# Patient Record
Sex: Female | Born: 1965 | Race: Black or African American | Hispanic: No | Marital: Single | Smoking: Never smoker
Health system: Southern US, Community
[De-identification: ages and names within clinical notes are randomized; demographics above are authoritative.]

## PROBLEM LIST (undated history)

## (undated) DIAGNOSIS — M1990 Unspecified osteoarthritis, unspecified site: Secondary | ICD-10-CM

## (undated) DIAGNOSIS — I10 Essential (primary) hypertension: Secondary | ICD-10-CM

## (undated) HISTORY — PX: BREAST LUMPECTOMY: SHX2

## (undated) HISTORY — PX: BREAST EXCISIONAL BIOPSY: SUR124

## (undated) HISTORY — PX: TUBAL LIGATION: SHX77

## (undated) HISTORY — PX: KNEE SURGERY: SHX244

---

## 1998-12-26 ENCOUNTER — Other Ambulatory Visit: Admission: RE | Admit: 1998-12-26 | Discharge: 1998-12-26 | Payer: Self-pay | Admitting: Obstetrics and Gynecology

## 1999-06-13 ENCOUNTER — Other Ambulatory Visit: Admission: RE | Admit: 1999-06-13 | Discharge: 1999-06-13 | Payer: Self-pay | Admitting: Obstetrics and Gynecology

## 2000-03-10 ENCOUNTER — Other Ambulatory Visit: Admission: RE | Admit: 2000-03-10 | Discharge: 2000-03-10 | Payer: Self-pay | Admitting: Obstetrics and Gynecology

## 2001-10-01 ENCOUNTER — Emergency Department (HOSPITAL_COMMUNITY): Admission: EM | Admit: 2001-10-01 | Discharge: 2001-10-01 | Payer: Self-pay | Admitting: Emergency Medicine

## 2002-07-11 ENCOUNTER — Ambulatory Visit (HOSPITAL_COMMUNITY): Admission: RE | Admit: 2002-07-11 | Discharge: 2002-07-11 | Payer: Self-pay | Admitting: Obstetrics

## 2002-07-11 ENCOUNTER — Encounter: Payer: Self-pay | Admitting: Obstetrics

## 2003-04-27 ENCOUNTER — Inpatient Hospital Stay (HOSPITAL_COMMUNITY): Admission: AD | Admit: 2003-04-27 | Discharge: 2003-04-27 | Payer: Self-pay | Admitting: Obstetrics

## 2003-04-29 ENCOUNTER — Inpatient Hospital Stay (HOSPITAL_COMMUNITY): Admission: AD | Admit: 2003-04-29 | Discharge: 2003-04-29 | Payer: Self-pay | Admitting: Obstetrics

## 2003-04-29 ENCOUNTER — Encounter (INDEPENDENT_AMBULATORY_CARE_PROVIDER_SITE_OTHER): Payer: Self-pay | Admitting: Specialist

## 2003-12-30 ENCOUNTER — Inpatient Hospital Stay (HOSPITAL_COMMUNITY): Admission: AD | Admit: 2003-12-30 | Discharge: 2003-12-30 | Payer: Self-pay | Admitting: Obstetrics

## 2004-01-05 ENCOUNTER — Inpatient Hospital Stay (HOSPITAL_COMMUNITY): Admission: AD | Admit: 2004-01-05 | Discharge: 2004-01-05 | Payer: Self-pay | Admitting: Obstetrics

## 2004-01-13 ENCOUNTER — Encounter (HOSPITAL_COMMUNITY): Admission: RE | Admit: 2004-01-13 | Discharge: 2004-01-26 | Payer: Self-pay | Admitting: Obstetrics

## 2004-01-27 ENCOUNTER — Encounter (HOSPITAL_COMMUNITY): Admission: RE | Admit: 2004-01-27 | Discharge: 2004-02-26 | Payer: Self-pay | Admitting: Obstetrics

## 2004-03-02 ENCOUNTER — Encounter (HOSPITAL_COMMUNITY): Admission: RE | Admit: 2004-03-02 | Discharge: 2004-04-01 | Payer: Self-pay | Admitting: Obstetrics

## 2004-03-28 ENCOUNTER — Inpatient Hospital Stay (HOSPITAL_COMMUNITY): Admission: AD | Admit: 2004-03-28 | Discharge: 2004-03-28 | Payer: Self-pay | Admitting: Obstetrics

## 2004-06-07 ENCOUNTER — Inpatient Hospital Stay (HOSPITAL_COMMUNITY): Admission: AD | Admit: 2004-06-07 | Discharge: 2004-06-09 | Payer: Self-pay | Admitting: Obstetrics

## 2004-06-08 ENCOUNTER — Encounter (INDEPENDENT_AMBULATORY_CARE_PROVIDER_SITE_OTHER): Payer: Self-pay | Admitting: *Deleted

## 2004-06-10 ENCOUNTER — Ambulatory Visit: Admission: RE | Admit: 2004-06-10 | Discharge: 2004-06-10 | Payer: Self-pay | Admitting: Obstetrics

## 2007-09-06 ENCOUNTER — Emergency Department (HOSPITAL_COMMUNITY): Admission: EM | Admit: 2007-09-06 | Discharge: 2007-09-06 | Payer: Self-pay | Admitting: Emergency Medicine

## 2009-04-29 ENCOUNTER — Emergency Department (HOSPITAL_COMMUNITY): Admission: EM | Admit: 2009-04-29 | Discharge: 2009-04-29 | Payer: Self-pay | Admitting: Emergency Medicine

## 2009-06-06 ENCOUNTER — Ambulatory Visit (HOSPITAL_COMMUNITY): Admission: RE | Admit: 2009-06-06 | Discharge: 2009-06-06 | Payer: Self-pay | Admitting: Obstetrics

## 2009-07-25 ENCOUNTER — Encounter: Admission: RE | Admit: 2009-07-25 | Discharge: 2009-10-22 | Payer: Self-pay | Admitting: Orthopedic Surgery

## 2010-06-17 ENCOUNTER — Other Ambulatory Visit (HOSPITAL_COMMUNITY): Payer: Self-pay | Admitting: Obstetrics

## 2010-06-17 DIAGNOSIS — Z1231 Encounter for screening mammogram for malignant neoplasm of breast: Secondary | ICD-10-CM

## 2010-06-28 ENCOUNTER — Ambulatory Visit (HOSPITAL_COMMUNITY): Payer: BC Managed Care – PPO | Attending: Obstetrics

## 2010-09-13 NOTE — Discharge Summary (Signed)
NAMETUWANDA, VOKES                  ACCOUNT NO.:  [id number]   MEDICAL RECORD NO.:  [id number]          PATIENT TYPE:  INP   LOCATION:  9111                          FACILITY:  [facility]   PHYSICIAN:  Kathreen Cosier, M.D.DATE OF BIRTH:  1965/12/01   DATE OF ADMISSION:  06/07/2004  DATE OF DISCHARGE:  06/09/2004                                 DISCHARGE SUMMARY   HOSPITAL COURSE:  The patient is a 45 year old gravida 4 para 2-0-1-2 with  [facility] June 09, 2004 admitted in labor. The patient had a normal vaginal  delivery and desired postpartum tubal ligation. On admission her hemoglobin  11.3, postoperatively 10.1. White count 10.9 and 12. Platelets 156 and 147.  She underwent a postpartum tubal ligation on June 08, 2004 and did well.  She was discharged home on June 09, 2004 - her second postpartum day -  to see me in 6 weeks.   DISCHARGE DIAGNOSIS:  Status post normal vaginal delivery at term and  postpartum tubal ligation.      BAM/MEDQ  D:  07/24/2004  T:  07/24/2004  Job:  [id number]

## 2010-09-13 NOTE — Op Note (Signed)
NAMECurlene Shea NO.:  [id number]   MEDICAL RECORD NO.:  [id number]            PATIENT TYPE:   LOCATION:                                 FACILITY:   PHYSICIAN:  Kathreen Cosier, M.D.   DATE OF BIRTH:   DATE OF PROCEDURE:  06/08/2004  DATE OF DISCHARGE:                                 OPERATIVE REPORT   PREOPERATIVE DIAGNOSIS:  Multiparity.   OPERATION:  Postpartum tubal ligation.   Using epidural, the patient in the supine position, abdomen prepped and  draped.  Bladder emptied with a straight catheter.  A midline subumbilical  incision 1 inch long was made, and carried down through the fascia.  The  fascia cleaned then grasped with the Mayo scissors, the fascia the  peritoneum were opened with Mayo scissors.  The left tube grasped in the mid  portion with a Babcock clamp, __________ placed in the mesosalpinx below the  portion of the tube __________ This was tied, approximately 1 inch of tube  transected.  Hemostasis was satisfactory.  Procedure done in a similar  fashion on the other side.  Probes removed, abdomen closed in layers.  Peritoneum and fascia closed with continuous double-0 Dexon and the skin  closed with a subcuticular stitch of 4-0 Monocryl.   ESTIMATED BLOOD LOSS:  Less than 5 mL.      BAM/MEDQ  D:  06/08/2004  T:  06/08/2004  Job:  [id number]

## 2011-05-27 ENCOUNTER — Other Ambulatory Visit (HOSPITAL_COMMUNITY): Payer: Self-pay | Admitting: Obstetrics

## 2011-05-27 DIAGNOSIS — Z1231 Encounter for screening mammogram for malignant neoplasm of breast: Secondary | ICD-10-CM

## 2011-06-25 ENCOUNTER — Ambulatory Visit (HOSPITAL_COMMUNITY): Payer: BC Managed Care – PPO

## 2011-07-17 ENCOUNTER — Ambulatory Visit (HOSPITAL_COMMUNITY): Payer: BC Managed Care – PPO

## 2011-07-18 ENCOUNTER — Ambulatory Visit (HOSPITAL_COMMUNITY)
Admission: RE | Admit: 2011-07-18 | Discharge: 2011-07-18 | Disposition: A | Discharge status: Home / Self Care | Payer: BC Managed Care – PPO | Source: Ambulatory Visit | Attending: Obstetrics | Admitting: Obstetrics

## 2011-07-18 DIAGNOSIS — Z1231 Encounter for screening mammogram for malignant neoplasm of breast: Secondary | ICD-10-CM | POA: Insufficient documentation

## 2011-07-22 ENCOUNTER — Other Ambulatory Visit: Payer: Self-pay | Admitting: Obstetrics

## 2011-07-22 DIAGNOSIS — R928 Other abnormal and inconclusive findings on diagnostic imaging of breast: Secondary | ICD-10-CM

## 2011-07-25 ENCOUNTER — Ambulatory Visit
Admission: RE | Admit: 2011-07-25 | Discharge: 2011-07-25 | Disposition: A | Discharge status: Home / Self Care | Payer: BC Managed Care – PPO | Source: Ambulatory Visit | Attending: Obstetrics | Admitting: Obstetrics

## 2011-07-25 DIAGNOSIS — R928 Other abnormal and inconclusive findings on diagnostic imaging of breast: Secondary | ICD-10-CM

## 2012-09-24 ENCOUNTER — Encounter: Payer: Self-pay | Admitting: Family

## 2012-09-24 ENCOUNTER — Ambulatory Visit (INDEPENDENT_AMBULATORY_CARE_PROVIDER_SITE_OTHER): Payer: BC Managed Care – PPO | Admitting: Family

## 2012-09-24 VITALS — BP 142/90 | HR 71 | Ht 66.0 in | Wt 264.0 lb

## 2012-09-24 DIAGNOSIS — I10 Essential (primary) hypertension: Secondary | ICD-10-CM | POA: Insufficient documentation

## 2012-09-24 DIAGNOSIS — E669 Obesity, unspecified: Secondary | ICD-10-CM | POA: Insufficient documentation

## 2012-09-24 MED ORDER — HYDROCHLOROTHIAZIDE 25 MG PO TABS: 12.5000 mg | ORAL_TABLET | Freq: Every day | ORAL | Status: DC

## 2012-09-24 NOTE — Progress Notes (Signed)
  Subjective:    Patient ID: Sandra Shea, female    DOB: 1965-09-29, 47 y.o.   MRN: [id number]  HPI 47 year old [place] American female, nonsmoker, new patient to the practice is in to be established. She has concerns of elevated blood pressure but does not going for several weeks. At home she is getting blood pressure readings in the 150s systolically. She has a family history of hypertension her mother, sister but her father's family history is unknown. She reports exercise once a week, Cardio and dance. Reports eating late meals and does not watch her sodium intake.   Review of Systems  Constitutional: Negative.   HENT: Negative.   Respiratory: Negative.   Cardiovascular: Negative.   Gastrointestinal: Negative.   Genitourinary: Negative.   Musculoskeletal: Negative.   Skin: Negative.   Allergic/Immunologic: Negative.   Neurological: Negative.   Hematological: Negative.   Psychiatric/Behavioral: Negative.    Past Medical History  Diagnosis Date  . Hyperlipidemia     History   Social History  . Marital Status: Single    Spouse Name: N/A    Number of Children: N/A  . Years of Education: N/A   Occupational History  . Not on file.   Social History Main Topics  . Smoking status: Never Smoker   . Smokeless tobacco: Not on file  . Alcohol Use: Yes     Comment: socially  . Drug Use: No  . Sexually Active: Not on file   Other Topics Concern  . Not on file   Social History Narrative  . No narrative on file    Past Surgical History  Procedure Laterality Date  . Knee surgery      No family history on file.  Allergies  Allergen Reactions  . Sulfa Antibiotics     No current outpatient prescriptions on file prior to visit.   No current facility-administered medications on file prior to visit.    BP 142/90  Pulse 71  Ht 5\' 6"  (1.676 m)  Wt 264 lb (119.75 kg)  BMI 42.63 kg/m2  SpO2 98%chart    Objective:   Physical Exam  Constitutional: She is oriented to  person, place, and time. She appears well-developed and well-nourished.  HENT:  Right Ear: External ear normal.  Left Ear: External ear normal.  Nose: Nose normal.  Mouth/Throat: Oropharynx is clear and moist.  Neck: Normal range of motion. Neck supple. No thyromegaly present.  Cardiovascular: Normal rate, regular rhythm and normal heart sounds.   Pulmonary/Chest: Effort normal and breath sounds normal.  Abdominal: Soft. Bowel sounds are normal.  Musculoskeletal: Normal range of motion.  Neurological: She is alert and oriented to person, place, and time.  Skin: Skin is warm and dry.  Psychiatric: She has a normal mood and affect.          Assessment and Plan:  Assessment:  1. Hypertension-uncontrolled 2. Obesity  Plan: We'll start with hydrochlorothiazide 12.5 mg once daily. Encouraged low sodium diet. Exercise 30 minutes at least 4 days a week. We'll follow with patient for complete physical exam in 3 weeks and sooner as needed. Advised to see gynecology as soon as possible since she has not seen him in quite some time. However, I have also suggested that she allow is to do her Pap smear she does not see gynecology.

## 2012-09-24 NOTE — Patient Instructions (Signed)
Hypertension As your heart beats, it forces blood through your arteries. This force is your blood pressure. If the pressure is too high, it is called hypertension (HTN) or high blood pressure. HTN is dangerous because you may have it and not know it. High blood pressure may mean that your heart has to work harder to pump blood. Your arteries may be narrow or stiff. The extra work puts you at risk for heart disease, stroke, and other problems.  Blood pressure consists of two numbers, a higher number over a lower, 110/72, for example. It is stated as "110 over 72." The ideal is below 120 for the top number (systolic) and under 80 for the bottom (diastolic). Write down your blood pressure today. You should pay close attention to your blood pressure if you have certain conditions such as:  Heart failure.  Prior heart attack.  Diabetes  Chronic kidney disease.  Prior stroke.  Multiple risk factors for heart disease. To see if you have HTN, your blood pressure should be measured while you are seated with your arm held at the level of the heart. It should be measured at least twice. A one-time elevated blood pressure reading (especially in the Emergency Department) does not mean that you need treatment. There may be conditions in which the blood pressure is different between your right and left arms. It is important to see your caregiver soon for a recheck. Most people have essential hypertension which means that there is not a specific cause. This type of high blood pressure may be lowered by changing lifestyle factors such as:  Stress.  Smoking.  Lack of exercise.  Excessive weight.  Drug/tobacco/alcohol use.  Eating less salt. Most people do not have symptoms from high blood pressure until it has caused damage to the body. Effective treatment can often prevent, delay or reduce that damage. TREATMENT  When a cause has been identified, treatment for high blood pressure is directed at the  cause. There are a large number of medications to treat HTN. These fall into several categories, and your caregiver will help you select the medicines that are best for you. Medications may have side effects. You should review side effects with your caregiver. If your blood pressure stays high after you have made lifestyle changes or started on medicines,   Your medication(s) may need to be changed.  Other problems may need to be addressed.  Be certain you understand your prescriptions, and know how and when to take your medicine.  Be sure to follow up with your caregiver within the time frame advised (usually within two weeks) to have your blood pressure rechecked and to review your medications.  If you are taking more than one medicine to lower your blood pressure, make sure you know how and at what times they should be taken. Taking two medicines at the same time can result in blood pressure that is too low. SEEK IMMEDIATE MEDICAL CARE IF:  You develop a severe headache, blurred or changing vision, or confusion.  You have unusual weakness or numbness, or a faint feeling.  You have severe chest or abdominal pain, vomiting, or breathing problems. MAKE SURE YOU:   Understand these instructions.  Will watch your condition.  Will get help right away if you are not doing well or get worse. Document Released: 04/14/2005 Document Revised: 07/07/2011 Document Reviewed: 12/03/2007 ExitCare Patient Information 2014 ExitCare, [place].   1.5 Gram Low Sodium Diet A 1.5 gram sodium diet restricts the amount of  sodium in the diet to no more than 1.5 g or 1500 mg daily. The American Heart Association recommends Americans over the age of 33 to consume no more than 1500 mg of sodium each day to reduce the risk of developing high blood pressure. Research also shows that limiting sodium may reduce heart attack and stroke risk. Many foods contain sodium for flavor and sometimes as a preservative. When the  amount of sodium in a diet needs to be low, it is important to know what to look for when choosing foods and drinks. The following includes some information and guidelines to help make it easier for you to adapt to a low sodium diet. QUICK TIPS  Do not add salt to food.  Avoid convenience items and fast food.  Choose unsalted snack foods.  Buy lower sodium products, often labeled as "lower sodium" or "no salt added."  Check food labels to learn how much sodium is in 1 serving.  When eating at a restaurant, ask that your food be prepared with less salt or none, if possible. READING FOOD LABELS FOR SODIUM INFORMATION The nutrition facts label is a good place to find how much sodium is in foods. Look for products with no more than 400 mg of sodium per serving. Remember that 1.5 g = 1500 mg. The food label may also list foods as:  Sodium-free: Less than 5 mg in a serving.  Very low sodium: 35 mg or less in a serving.  Low-sodium: 140 mg or less in a serving.  Light in sodium: 50% less sodium in a serving. For example, if a food that usually has 300 mg of sodium is changed to become light in sodium, it will have 150 mg of sodium.  Reduced sodium: 25% less sodium in a serving. For example, if a food that usually has 400 mg of sodium is changed to reduced sodium, it will have 300 mg of sodium. CHOOSING FOODS Grains  Avoid: Salted crackers and snack items. Some cereals, including instant hot cereals. Bread stuffing and biscuit mixes. Seasoned rice or pasta mixes.  Choose: Unsalted snack items. Low-sodium cereals, oats, puffed wheat and rice, shredded wheat. English muffins and bread. Pasta. Meats  Avoid: Salted, canned, smoked, spiced, pickled meats, including fish and poultry. Bacon, ham, sausage, cold cuts, hot dogs, anchovies.  Choose: Low-sodium canned tuna and salmon. Fresh or frozen meat, poultry, and fish. Dairy  Avoid: Processed cheese and spreads. Cottage cheese. Buttermilk  and condensed milk. Regular cheese.  Choose: Milk. Low-sodium cottage cheese. Yogurt. Sour cream. Low-sodium cheese. Fruits and Vegetables  Avoid: Regular canned vegetables. Regular canned tomato sauce and paste. Frozen vegetables in sauces. Olives. Rosita Fire. Relishes. Sauerkraut.  Choose: Low-sodium canned vegetables. Low-sodium tomato sauce and paste. Frozen or fresh vegetables. Fresh and frozen fruit. Condiments  Avoid: Canned and packaged gravies. Worcestershire sauce. Tartar sauce. Barbecue sauce. Soy sauce. Steak sauce. Ketchup. Onion, garlic, and table salt. Meat flavorings and tenderizers.  Choose: Fresh and dried herbs and spices. Low-sodium varieties of mustard and ketchup. Lemon juice. Tabasco sauce. Horseradish. SAMPLE 1.5 GRAM SODIUM MEAL PLAN Breakfast / Sodium (mg)  1 cup low-fat milk / 143 mg  1 whole-wheat English muffin / 240 mg  1 tbs heart-healthy margarine / 153 mg  1 hard-boiled egg / 139 mg  1 small orange / 0 mg Lunch / Sodium (mg)  1 cup raw carrots / 76 mg  2 tbs no salt added peanut butter / 5 mg  2 slices whole-wheat  bread / 270 mg  1 tbs jelly / 6 mg   cup red grapes / 2 mg Dinner / Sodium (mg)  1 cup whole-wheat pasta / 2 mg  1 cup low-sodium tomato sauce / 73 mg  3 oz lean ground beef / 57 mg  1 small side salad (1 cup raw spinach leaves,  cup cucumber,  cup yellow bell pepper) with 1 tsp olive oil and 1 tsp red wine vinegar / 25 mg Snack / Sodium (mg)  1 container low-fat vanilla yogurt / 107 mg  3 graham cracker squares / 127 mg Nutrient Analysis  Calories: 1745  Protein: 75 g  Carbohydrate: 237 g  Fat: 57 g  Sodium: 1425 mg Document Released: 04/14/2005 Document Revised: 07/07/2011 Document Reviewed: 07/16/2009 ExitCare Patient Information 2014 [place], [place].

## 2012-10-04 ENCOUNTER — Ambulatory Visit: Payer: BC Managed Care – PPO | Admitting: Family

## 2012-11-22 ENCOUNTER — Other Ambulatory Visit (HOSPITAL_COMMUNITY): Payer: Self-pay | Admitting: Obstetrics

## 2012-11-22 DIAGNOSIS — Z1231 Encounter for screening mammogram for malignant neoplasm of breast: Secondary | ICD-10-CM

## 2012-11-23 ENCOUNTER — Ambulatory Visit (HOSPITAL_COMMUNITY): Payer: BC Managed Care – PPO

## 2012-11-24 ENCOUNTER — Ambulatory Visit (HOSPITAL_COMMUNITY)
Admission: RE | Admit: 2012-11-24 | Discharge: 2012-11-24 | Disposition: A | Discharge status: Home / Self Care | Payer: BC Managed Care – PPO | Source: Ambulatory Visit | Attending: Obstetrics | Admitting: Obstetrics

## 2012-11-24 DIAGNOSIS — Z1231 Encounter for screening mammogram for malignant neoplasm of breast: Secondary | ICD-10-CM | POA: Insufficient documentation

## 2012-11-25 ENCOUNTER — Other Ambulatory Visit: Payer: Self-pay | Admitting: Obstetrics

## 2012-11-25 DIAGNOSIS — R928 Other abnormal and inconclusive findings on diagnostic imaging of breast: Secondary | ICD-10-CM

## 2012-12-01 ENCOUNTER — Ambulatory Visit
Admission: RE | Admit: 2012-12-01 | Discharge: 2012-12-01 | Disposition: A | Discharge status: Home / Self Care | Payer: BC Managed Care – PPO | Source: Ambulatory Visit | Attending: Obstetrics | Admitting: Obstetrics

## 2012-12-01 DIAGNOSIS — R928 Other abnormal and inconclusive findings on diagnostic imaging of breast: Secondary | ICD-10-CM

## 2013-07-11 ENCOUNTER — Encounter: Payer: Self-pay | Admitting: Family

## 2013-07-11 ENCOUNTER — Ambulatory Visit (INDEPENDENT_AMBULATORY_CARE_PROVIDER_SITE_OTHER): Payer: BC Managed Care – PPO | Admitting: Family

## 2013-07-11 ENCOUNTER — Other Ambulatory Visit: Payer: Self-pay

## 2013-07-11 VITALS — BP 134/80 | HR 86 | Wt 262.0 lb

## 2013-07-11 DIAGNOSIS — I10 Essential (primary) hypertension: Secondary | ICD-10-CM

## 2013-07-11 MED ORDER — HYDROCHLOROTHIAZIDE 25 MG PO TABS: 12.5000 mg | ORAL_TABLET | Freq: Every day | ORAL | Status: DC

## 2013-07-12 NOTE — Progress Notes (Signed)
   Subjective:    Patient ID: Sandra Shea, female    DOB: 03/15/1966, 48 y.o.   MRN: [id number]  HPI 48 year old Afro-American female, presents today for recheck of hypertension and obesity. He is currently doing well on hydrochlorothiazide. However, she reports that she often forgets a dosage. She is not currently exercising or following any particular diet.   Review of Systems  Constitutional: Negative.   HENT: Negative.   Respiratory: Negative.   Cardiovascular: Negative.   Gastrointestinal: Negative.   Endocrine: Negative.   Genitourinary: Negative.   Musculoskeletal: Negative.   Skin: Negative.   Neurological: Negative.   Hematological: Negative.   Psychiatric/Behavioral: Negative.    Past Medical History  Diagnosis Date  . Hyperlipidemia     History   Social History  . Marital Status: Single    Spouse Name: N/A    Number of Children: N/A  . Years of Education: N/A   Occupational History  . Not on file.   Social History Main Topics  . Smoking status: Never Smoker   . Smokeless tobacco: Not on file  . Alcohol Use: Yes     Comment: socially  . Drug Use: No  . Sexual Activity: Not on file   Other Topics Concern  . Not on file   Social History Narrative  . No narrative on file    Past Surgical History  Procedure Laterality Date  . Knee surgery      No family history on file.  Allergies  Allergen Reactions  . Sulfa Antibiotics     Current Outpatient Prescriptions on File Prior to Visit  Medication Sig Dispense Refill  . terbinafine (LAMISIL) 250 MG tablet Take 250 mg by mouth daily.       No current facility-administered medications on file prior to visit.    BP 134/80  Pulse 86  Wt 262 lb (118.842 kg)chart    Objective:   Physical Exam  Constitutional: She is oriented to person, place, and time. She appears well-developed and well-nourished.  HENT:  Right Ear: External ear normal.  Left Ear: External ear normal.  Nose: Nose normal.    Neck: Normal range of motion. Neck supple.  Cardiovascular: Normal rate, regular rhythm and normal heart sounds.   Pulmonary/Chest: Effort normal and breath sounds normal.  Abdominal: Soft. Bowel sounds are normal.  Musculoskeletal: Normal range of motion.  Neurological: She is alert and oriented to person, place, and time.  Skin: Skin is warm and dry.  Psychiatric: She has a normal mood and affect.          Assessment & Plan:

## 2013-08-01 ENCOUNTER — Ambulatory Visit (INDEPENDENT_AMBULATORY_CARE_PROVIDER_SITE_OTHER): Payer: BC Managed Care – PPO | Admitting: Family

## 2013-08-01 ENCOUNTER — Encounter: Payer: Self-pay | Admitting: Family

## 2013-08-01 VITALS — BP 128/80 | HR 74 | Ht 66.0 in | Wt 263.0 lb

## 2013-08-01 DIAGNOSIS — M1711 Unilateral primary osteoarthritis, right knee: Secondary | ICD-10-CM

## 2013-08-01 DIAGNOSIS — E669 Obesity, unspecified: Secondary | ICD-10-CM

## 2013-08-01 DIAGNOSIS — Z23 Encounter for immunization: Secondary | ICD-10-CM

## 2013-08-01 DIAGNOSIS — IMO0002 Reserved for concepts with insufficient information to code with codable children: Secondary | ICD-10-CM

## 2013-08-01 DIAGNOSIS — Z0000 Encounter for general adult medical examination without abnormal findings: Secondary | ICD-10-CM

## 2013-08-01 DIAGNOSIS — I10 Essential (primary) hypertension: Secondary | ICD-10-CM

## 2013-08-01 DIAGNOSIS — M1710 Unilateral primary osteoarthritis, unspecified knee: Secondary | ICD-10-CM

## 2013-08-01 LAB — CBC WITH DIFFERENTIAL/PLATELET
BASOS ABS: 0 10*3/uL (ref 0.0–0.1)
Basophils Relative: 0.6 % (ref 0.0–3.0)
Eosinophils Absolute: 0.1 10*3/uL (ref 0.0–0.7)
Eosinophils Relative: 1.5 % (ref 0.0–5.0)
HCT: 37.3 % (ref 36.0–46.0)
HEMOGLOBIN: 12 g/dL (ref 12.0–15.0)
LYMPHS PCT: 36.8 % (ref 12.0–46.0)
Lymphs Abs: 2.2 10*3/uL (ref 0.7–4.0)
MCHC: 32.3 g/dL (ref 30.0–36.0)
MCV: 80.4 fl (ref 78.0–100.0)
MONOS PCT: 6.6 % (ref 3.0–12.0)
Monocytes Absolute: 0.4 10*3/uL (ref 0.1–1.0)
Neutro Abs: 3.3 10*3/uL (ref 1.4–7.7)
Neutrophils Relative %: 54.5 % (ref 43.0–77.0)
PLATELETS: 187 10*3/uL (ref 150.0–400.0)
RBC: 4.64 Mil/uL (ref 3.87–5.11)
RDW: 14.5 % (ref 11.5–14.6)
WBC: 6 10*3/uL (ref 4.5–10.5)

## 2013-08-01 LAB — COMPREHENSIVE METABOLIC PANEL
ALBUMIN: 3.6 g/dL (ref 3.5–5.2)
ALT: 10 U/L (ref 0–35)
AST: 16 U/L (ref 0–37)
Alkaline Phosphatase: 65 U/L (ref 39–117)
BILIRUBIN TOTAL: 0.7 mg/dL (ref 0.3–1.2)
BUN: 14 mg/dL (ref 6–23)
CALCIUM: 8.8 mg/dL (ref 8.4–10.5)
CHLORIDE: 103 meq/L (ref 96–112)
CO2: 28 meq/L (ref 19–32)
Creatinine, Ser: 0.8 mg/dL (ref 0.4–1.2)
GFR: 101.77 mL/min (ref >60.00)
GLUCOSE: 84 mg/dL (ref 70–99)
POTASSIUM: 3.7 meq/L (ref 3.5–5.1)
SODIUM: 138 meq/L (ref 135–145)
TOTAL PROTEIN: 7 g/dL (ref 6.0–8.3)

## 2013-08-01 LAB — POCT URINALYSIS DIPSTICK
BILIRUBIN UA: NEGATIVE
Glucose, UA: NEGATIVE
Ketones, UA: NEGATIVE
LEUKOCYTES UA: NEGATIVE
NITRITE UA: NEGATIVE
PH UA: 6.5
Protein, UA: NEGATIVE
Spec Grav, UA: 1.02
Urobilinogen, UA: 0.2

## 2013-08-01 LAB — LIPID PANEL
Cholesterol: 185 mg/dL (ref 0–200)
HDL: 45.5 mg/dL (ref >39.00)
LDL Cholesterol: 126 mg/dL — ABNORMAL HIGH (ref 0–99)
Total CHOL/HDL Ratio: 4
Triglycerides: 70 mg/dL (ref 0.0–149.0)
VLDL: 14 mg/dL (ref 0.0–40.0)

## 2013-08-01 LAB — TSH: TSH: 1.53 u[IU]/mL (ref 0.35–5.50)

## 2013-08-01 MED ORDER — MELOXICAM 15 MG PO TABS: 15.0000 mg | ORAL_TABLET | Freq: Every day | ORAL | Status: DC

## 2013-08-01 NOTE — Progress Notes (Signed)
Subjective:    Patient ID: Sandra Shea, female    DOB: 08/10/1965, 48 y.o.   MRN: [id number]  HPI  48 year old AAF, nonsmoker, is in for a CPX. I reviewed all health maintenance protocols including mammography, colonoscopy, bone density Needed referrals were placed. Age and diagnosis  appropriate screening labs were ordered. Her immunization history was reviewed and appropriate vaccinations were ordered. Her current medications and allergies were reviewed and needed refills of her chronic medications were ordered. The plan for yearly health maintenance was discussed all orders and referrals were made as appropriate.  Review of Systems  Constitutional: Negative.   HENT: Negative.   Eyes: Negative.   Respiratory: Negative.   Cardiovascular: Negative.   Gastrointestinal: Negative.   Endocrine: Negative.   Genitourinary: Negative.   Musculoskeletal: Negative.   Skin: Negative.   Allergic/Immunologic: Negative.   Neurological: Negative.   Hematological: Negative.   Psychiatric/Behavioral: Negative.    Past Medical History  Diagnosis Date  . Hyperlipidemia     History   Social History  . Marital Status: Single    Spouse Name: N/A    Number of Children: N/A  . Years of Education: N/A   Occupational History  . Not on file.   Social History Main Topics  . Smoking status: Never Smoker   . Smokeless tobacco: Not on file  . Alcohol Use: Yes     Comment: socially  . Drug Use: No  . Sexual Activity: Not on file   Other Topics Concern  . Not on file   Social History Narrative  . No narrative on file    Past Surgical History  Procedure Laterality Date  . Knee surgery      No family history on file.  Allergies  Allergen Reactions  . Sulfa Antibiotics     Current Outpatient Prescriptions on File Prior to Visit  Medication Sig Dispense Refill  . hydrochlorothiazide (HYDRODIURIL) 25 MG tablet Take 0.5 tablets (12.5 mg total) by mouth daily.  45 tablet  1  .  terbinafine (LAMISIL) 250 MG tablet Take 250 mg by mouth daily.       No current facility-administered medications on file prior to visit.    BP 128/80  Pulse 74  Ht 5\' 6"  (1.676 m)  Wt 263 lb (119.296 kg)  BMI 42.47 kg/m2  SpO2 98%chart     Objective:   Physical Exam  Constitutional: She is oriented to person, place, and time. She appears well-developed and well-nourished.  HENT:  Head: Normocephalic and atraumatic.  Right Ear: External ear normal.  Left Ear: External ear normal.  Nose: Nose normal.  Mouth/Throat: Oropharynx is clear and moist.  Eyes: Conjunctivae and EOM are normal. Pupils are equal, round, and reactive to light.  Neck: Normal range of motion. Neck supple. No thyromegaly present.  Cardiovascular: Normal rate and normal heart sounds.   Pulmonary/Chest: Effort normal and breath sounds normal.  Abdominal: Soft. Bowel sounds are normal.  Musculoskeletal: Normal range of motion.  Neurological: She is alert and oriented to person, place, and time. She has normal reflexes.  Skin: Skin is warm.  Psychiatric: She has a normal mood and affect.          Assessment & Plan:  Sandra Shea was seen today for annual exam.  Diagnoses and associated orders for this visit:  Preventative health care - Cancel: EKG 12-Lead - CMP - Lipid Panel - TSH - POCT urinalysis dipstick - CBC with Differential - EKG 12-Lead  Unspecified essential hypertension - Cancel: EKG 12-Lead - CMP - Lipid Panel - TSH - POCT urinalysis dipstick - CBC with Differential  Obesity, unspecified - CMP - Lipid Panel - TSH - POCT urinalysis dipstick - CBC with Differential  Osteoarthritis of right knee - CMP - Lipid Panel - TSH - POCT urinalysis dipstick - CBC with Differential  Need for prophylactic vaccination with combined diphtheria-tetanus-pertussis (DTP) vaccine - Tdap vaccine greater than or equal to 7yo IM  Other Orders - meloxicam (MOBIC) 15 MG tablet; Take 1 tablet (15 mg  total) by mouth daily.   Call the office if symptoms worsen or persist. Recheck as scheduled and as needed.

## 2013-08-01 NOTE — Progress Notes (Signed)
Pre visit review using our clinic review tool, if applicable. No additional management support is needed unless otherwise documented below in the visit note. 

## 2013-08-01 NOTE — Patient Instructions (Signed)

## 2013-08-02 ENCOUNTER — Telehealth: Payer: Self-pay | Admitting: Family

## 2013-08-02 NOTE — Telephone Encounter (Signed)
Relevant patient education mailed to patient.  

## 2013-08-08 ENCOUNTER — Telehealth: Payer: Self-pay

## 2013-08-08 MED ORDER — PHENTERMINE HCL 37.5 MG PO TABS: 37.5000 mg | ORAL_TABLET | Freq: Every day | ORAL | Status: DC

## 2013-08-08 MED ORDER — METRONIDAZOLE 0.75 % VA GEL: 1.0000 | Freq: Every day | VAGINAL | Status: DC

## 2013-08-08 NOTE — Telephone Encounter (Signed)
Pt aware Rx sent in for bacterial infection

## 2013-09-09 ENCOUNTER — Telehealth: Payer: Self-pay | Admitting: Family

## 2013-09-09 NOTE — Telephone Encounter (Signed)
Pt requesting refill of phentermine (ADIPEX-P) 37.5 MG tablet sent to [facility].

## 2013-09-12 NOTE — Telephone Encounter (Signed)
Ok to fill 

## 2013-09-14 ENCOUNTER — Other Ambulatory Visit: Payer: Self-pay | Admitting: Family

## 2013-09-20 NOTE — Telephone Encounter (Signed)
Needs OV. No

## 2013-09-20 NOTE — Telephone Encounter (Signed)
Called and spoke with pt and pt is aware.  Pt transferred to schedule appt.

## 2013-09-27 ENCOUNTER — Ambulatory Visit (INDEPENDENT_AMBULATORY_CARE_PROVIDER_SITE_OTHER): Payer: BC Managed Care – PPO | Admitting: Family

## 2013-09-27 ENCOUNTER — Encounter: Payer: Self-pay | Admitting: Family

## 2013-09-27 DIAGNOSIS — I10 Essential (primary) hypertension: Secondary | ICD-10-CM

## 2013-09-27 MED ORDER — PHENTERMINE HCL 37.5 MG PO TABS: 37.5000 mg | ORAL_TABLET | Freq: Every day | ORAL | Status: DC

## 2013-09-27 NOTE — Progress Notes (Signed)
Subjective:    Patient ID: Sandra Shea, female    DOB: 1965-04-30, 48 y.o.   MRN: [id number]  HPI 48 y.o. Black female presents for follow up after being started on phentermine for weight loss. Pt has a 12 pound weight loss. Pt states she is exercising by walking 1 mile per day which takes her about thirty minutes. She states she is only eating one meal a day which usually consist of a chicken stew. Acknowledges having dry mouth related to medication but denies palpitations, flutters, GI upset. Denies fever, chills, SOB and change in appetite.     Review of Systems  Constitutional: Negative.   Respiratory: Negative.   Cardiovascular: Negative.   Gastrointestinal: Negative.   Endocrine: Negative.   Musculoskeletal: Negative.   Skin: Negative.   Allergic/Immunologic: Negative.   Neurological: Negative.   Psychiatric/Behavioral: Negative.    Past Medical History  Diagnosis Date  . Hyperlipidemia     History   Social History  . Marital Status: Single    Spouse Name: N/A    Number of Children: N/A  . Years of Education: N/A   Occupational History  . Not on file.   Social History Main Topics  . Smoking status: Never Smoker   . Smokeless tobacco: Not on file  . Alcohol Use: Yes     Comment: socially  . Drug Use: No  . Sexual Activity: Not on file   Other Topics Concern  . Not on file   Social History Narrative  . No narrative on file    Past Surgical History  Procedure Laterality Date  . Knee surgery      No family history on file.  Allergies  Allergen Reactions  . Sulfa Antibiotics     Current Outpatient Prescriptions on File Prior to Visit  Medication Sig Dispense Refill  . hydrochlorothiazide (HYDRODIURIL) 25 MG tablet Take 0.5 tablets (12.5 mg total) by mouth daily.  45 tablet  1  . meloxicam (MOBIC) 15 MG tablet Take 1 tablet (15 mg total) by mouth daily.  30 tablet  4  . metroNIDAZOLE (METROGEL VAGINAL) 0.75 % vaginal gel Place 1 Applicatorful  vaginally at bedtime.  70 g  0  . terbinafine (LAMISIL) 250 MG tablet Take 250 mg by mouth daily.       No current facility-administered medications on file prior to visit.    BP 120/80  Pulse 90  Wt 251 lb (113.853 kg)  SpO2 98%chart    Objective:   Physical Exam  Constitutional: She is oriented to person, place, and time. She appears well-developed and well-nourished. She is active.  Cardiovascular: Normal rate, regular rhythm, normal heart sounds and normal pulses.   Pulmonary/Chest: Effort normal and breath sounds normal.  Abdominal: Soft. Normal appearance and bowel sounds are normal.  Neurological: She is alert and oriented to person, place, and time. She has normal strength.  Skin: Skin is warm, dry and intact.  Psychiatric: She has a normal mood and affect. Her speech is normal and behavior is normal. Thought content normal.          Assessment & Plan:  Sandra Shea was seen today for weight check.  Diagnoses and associated orders for this visit:  Morbid obesity  Unspecified essential hypertension  Other Orders - phentermine (ADIPEX-P) 37.5 MG tablet; Take 1 tablet (37.5 mg total) by mouth daily before breakfast.   Education: Increase exercise at least 30 minutes per day. Eat healthy diet high in vegetables and fruits and  decrease salt and fat intake.   Follow up: One month.

## 2013-09-27 NOTE — Progress Notes (Signed)
Pre visit review using our clinic review tool, if applicable. No additional management support is needed unless otherwise documented below in the visit note. 

## 2013-09-27 NOTE — Patient Instructions (Signed)

## 2013-11-24 ENCOUNTER — Other Ambulatory Visit (HOSPITAL_COMMUNITY): Payer: Self-pay | Admitting: Obstetrics

## 2013-11-24 DIAGNOSIS — Z1231 Encounter for screening mammogram for malignant neoplasm of breast: Secondary | ICD-10-CM

## 2013-12-05 ENCOUNTER — Ambulatory Visit (HOSPITAL_COMMUNITY)
Admission: RE | Admit: 2013-12-05 | Discharge: 2013-12-05 | Disposition: A | Discharge status: Home / Self Care | Payer: BC Managed Care – PPO | Source: Ambulatory Visit | Attending: Obstetrics | Admitting: Obstetrics

## 2013-12-05 ENCOUNTER — Other Ambulatory Visit (HOSPITAL_COMMUNITY): Payer: Self-pay | Admitting: Obstetrics

## 2013-12-05 DIAGNOSIS — Z1231 Encounter for screening mammogram for malignant neoplasm of breast: Secondary | ICD-10-CM

## 2014-02-27 ENCOUNTER — Encounter: Payer: Self-pay | Admitting: Family

## 2014-04-23 ENCOUNTER — Encounter (HOSPITAL_BASED_OUTPATIENT_CLINIC_OR_DEPARTMENT_OTHER): Payer: Self-pay | Admitting: *Deleted

## 2014-04-23 ENCOUNTER — Emergency Department (HOSPITAL_BASED_OUTPATIENT_CLINIC_OR_DEPARTMENT_OTHER)
Admission: EM | Admit: 2014-04-23 | Discharge: 2014-04-23 | Disposition: A | Discharge status: Home / Self Care | Payer: BC Managed Care – PPO | Attending: Emergency Medicine | Admitting: Emergency Medicine

## 2014-04-23 DIAGNOSIS — M1990 Unspecified osteoarthritis, unspecified site: Secondary | ICD-10-CM | POA: Diagnosis not present

## 2014-04-23 DIAGNOSIS — Z8639 Personal history of other endocrine, nutritional and metabolic disease: Secondary | ICD-10-CM | POA: Insufficient documentation

## 2014-04-23 DIAGNOSIS — Z79899 Other long term (current) drug therapy: Secondary | ICD-10-CM | POA: Insufficient documentation

## 2014-04-23 DIAGNOSIS — Z791 Long term (current) use of non-steroidal anti-inflammatories (NSAID): Secondary | ICD-10-CM | POA: Diagnosis not present

## 2014-04-23 DIAGNOSIS — M25562 Pain in left knee: Secondary | ICD-10-CM | POA: Diagnosis not present

## 2014-04-23 MED ORDER — HYDROCODONE-ACETAMINOPHEN 5-325 MG PO TABS: 1.0000 | ORAL_TABLET | ORAL | Status: DC | PRN

## 2014-04-23 NOTE — Discharge Instructions (Signed)
Take the prescribed medication as directed.  May wish to ice and elevate knee to help with pain/swelling. Follow-up with your primary care physician. Return to the ED for new or worsening symptoms.

## 2014-04-23 NOTE — ED Provider Notes (Signed)
CSN: [id number]     Arrival date & time 04/23/14  1226 History   First MD Initiated Contact with Patient 04/23/14 1326     Chief Complaint  Patient presents with  . Knee Pain     (Consider location/radiation/quality/duration/timing/severity/associated sxs/prior Treatment) Patient is a 48 y.o. female presenting with knee pain. The history is provided by the patient and medical records.  Knee Pain   This is a 48 year old female with past medical history significant for hyperlipidemia and arthritis, presenting to the ED for left knee pain. Patient states symptoms initially began 2 weeks ago and have been progressively worsening.  No reported injuries, trauma, or falls. Patient states pain localized along the medial joint line of her left knee with some mild swelling. Pain is worse with weightbearing, ambulation, and flexion of left knee. She has had known arthritis in this knee and is due for knee replacement in her right knee as well. She denies any numbness, paresthesias, or weakness of left leg. Patient states she has been taking her daily Mobic, but is no longer controlling her pain.  Past Medical History  Diagnosis Date  . Hyperlipidemia    Past Surgical History  Procedure Laterality Date  . Knee surgery     No family history on file. History  Substance Use Topics  . Smoking status: Never Smoker   . Smokeless tobacco: Not on file  . Alcohol Use: Yes     Comment: socially   OB History    Gravida Para Term Preterm AB TAB SAB Ectopic Multiple Living   4 3             Review of Systems  Musculoskeletal: Positive for arthralgias.  All other systems reviewed and are negative.   Allergies  Sulfa antibiotics  Home Medications   Prior to Admission medications   Medication Sig Start Date End Date Taking? Authorizing Provider  hydrochlorothiazide (HYDRODIURIL) 25 MG tablet Take 0.5 tablets (12.5 mg total) by mouth daily. 07/11/13   Timoteo Gaul, FNP  meloxicam (MOBIC) 15  MG tablet Take 1 tablet (15 mg total) by mouth daily. 08/01/13   Timoteo Gaul, FNP  metroNIDAZOLE (METROGEL VAGINAL) 0.75 % vaginal gel Place 1 Applicatorful vaginally at bedtime. 08/08/13   Timoteo Gaul, FNP  phentermine (ADIPEX-P) 37.5 MG tablet Take 1 tablet (37.5 mg total) by mouth daily before breakfast. 09/27/13   Timoteo Gaul, FNP  terbinafine (LAMISIL) 250 MG tablet Take 250 mg by mouth daily.    Historical Provider, MD   BP 142/99 mmHg  Pulse 99  Temp(Src) 98.2 F (36.8 C) (Oral)  Resp 16  SpO2 99%   Physical Exam  Constitutional: She is oriented to person, place, and time. She appears well-developed and well-nourished. No distress.  HENT:  Head: Normocephalic and atraumatic.  Mouth/Throat: Oropharynx is clear and moist.  Eyes: Conjunctivae and EOM are normal. Pupils are equal, round, and reactive to light.  Neck: Normal range of motion. Neck supple.  Cardiovascular: Normal rate, regular rhythm and normal heart sounds.   Pulmonary/Chest: Effort normal and breath sounds normal. No respiratory distress. She has no wheezes.  Musculoskeletal:       Left knee: She exhibits decreased range of motion and swelling. She exhibits no effusion, no ecchymosis and no deformity. Tenderness found. Medial joint line tenderness noted.  Left knee with tenderness and mild swelling along medial joint line, no gross bony deformities, limited flexion due to pain, DP pulse and sensation intact, ambulating unassisted  without difficulty No calf asymmetry, tenderness, or palpable cords; no overlying erythema or warmth to touch  Neurological: She is alert and oriented to person, place, and time.  Skin: Skin is warm and dry. She is not diaphoretic.  Psychiatric: She has a normal mood and affect.  Nursing note and vitals reviewed.   ED Course  Procedures (including critical care time) Labs Review Labs Reviewed - No data to display  Imaging Review No results found.   EKG  Interpretation None      MDM   Final diagnoses:  Knee pain, left   48 year old female with atraumatic left knee pain, likely due to her arthritis. Low suspicion for acute fracture, dislocation, septic joint, or gout and therefore do not feel imaging needed at this time.  No clinical signs of DVT.  Patient started on short course of Vicodin, encouraged to continue her anti-inflammatories. Ace wrap was applied for comfort. She will follow-up with her primary care physician.  Discussed plan with patient, he/she acknowledged understanding and agreed with plan of care.  Return precautions given for new or worsening symptoms.  NEYSHA CRIADO, PA-C 04/23/14 Shelly, MD 04/25/14 0020

## 2014-04-23 NOTE — ED Notes (Signed)
Patient having pain in her left knee, started 2 weeks ago

## 2014-07-28 ENCOUNTER — Telehealth: Payer: Self-pay | Admitting: Family

## 2014-07-28 NOTE — Telephone Encounter (Signed)
HCTZ refilled. Needs OV for further refills.

## 2014-07-28 NOTE — Telephone Encounter (Signed)
Pt request refill of the following: hydrochlorothiazide (HYDRODIURIL) 25 MG tablet phentermine (ADIPEX-P) 37.5 MG tablet   Phamacy: [facility]

## 2014-07-31 MED ORDER — HYDROCHLOROTHIAZIDE 25 MG PO TABS: 12.5000 mg | ORAL_TABLET | Freq: Every day | ORAL | Status: DC

## 2014-07-31 NOTE — Telephone Encounter (Signed)
S/w pt and gave her the info  °

## 2014-07-31 NOTE — Telephone Encounter (Signed)
done

## 2014-07-31 NOTE — Telephone Encounter (Signed)
Can you close this please

## 2014-10-18 ENCOUNTER — Ambulatory Visit (INDEPENDENT_AMBULATORY_CARE_PROVIDER_SITE_OTHER): Payer: BC Managed Care – PPO | Admitting: Family

## 2014-10-18 ENCOUNTER — Encounter: Payer: Self-pay | Admitting: Family

## 2014-10-18 VITALS — BP 120/80 | HR 64 | Temp 97.9°F | Wt 257.0 lb

## 2014-10-18 DIAGNOSIS — E669 Obesity, unspecified: Secondary | ICD-10-CM | POA: Diagnosis not present

## 2014-10-18 DIAGNOSIS — M1712 Unilateral primary osteoarthritis, left knee: Secondary | ICD-10-CM | POA: Diagnosis not present

## 2014-10-18 DIAGNOSIS — I10 Essential (primary) hypertension: Secondary | ICD-10-CM | POA: Diagnosis not present

## 2014-10-18 LAB — COMPREHENSIVE METABOLIC PANEL
ALBUMIN: 4.5 g/dL (ref 3.5–5.2)
ALT: 13 U/L (ref 0–35)
AST: 18 U/L (ref 0–37)
Alkaline Phosphatase: 69 U/L (ref 39–117)
BUN: 9 mg/dL (ref 6–23)
CHLORIDE: 101 meq/L (ref 96–112)
CO2: 31 mEq/L (ref 19–32)
Calcium: 10.1 mg/dL (ref 8.4–10.5)
Creatinine, Ser: 0.91 mg/dL (ref 0.40–1.20)
GFR: 84.74 mL/min (ref >60.00)
Glucose, Bld: 91 mg/dL (ref 70–99)
POTASSIUM: 3.7 meq/L (ref 3.5–5.1)
SODIUM: 138 meq/L (ref 135–145)
TOTAL PROTEIN: 8.2 g/dL (ref 6.0–8.3)
Total Bilirubin: 0.7 mg/dL (ref 0.2–1.2)

## 2014-10-18 LAB — TSH: TSH: 1.45 u[IU]/mL (ref 0.35–4.50)

## 2014-10-18 MED ORDER — HYDROCHLOROTHIAZIDE 12.5 MG PO TABS: 12.5000 mg | ORAL_TABLET | Freq: Every day | ORAL | Status: DC

## 2014-10-18 MED ORDER — PHENTERMINE HCL 37.5 MG PO TABS: 37.5000 mg | ORAL_TABLET | Freq: Every day | ORAL | Status: DC

## 2014-10-18 MED ORDER — MELOXICAM 15 MG PO TABS: 15.0000 mg | ORAL_TABLET | Freq: Every day | ORAL | Status: DC

## 2014-10-18 NOTE — Progress Notes (Signed)
Pre visit review using our clinic review tool, if applicable. No additional management support is needed unless otherwise documented below in the visit note. 

## 2014-10-18 NOTE — Progress Notes (Signed)
Subjective:    Patient ID: Sandra Shea, female    DOB: December 06, 1965, 49 y.o.   MRN: [id number]  HPI  49 year old African-American female, nonsmoker with a history of hypertension, obesity, and osteoarthritis is in today for recheck. Denies any concerns. Needs medication refills. Attempting to exercise and follow a healthy diet. However, she has not been here in a year and her weight is up 6 pounds. Has not taken phentermine and normal sleep year. Takes hydrochlorothiazide 12.5 mg once daily for blood pressure management that she tolerates well. Takes meloxicam osteoarthritis.  Review of Systems  Constitutional: Negative.   HENT: Negative.   Respiratory: Negative.   Cardiovascular: Negative.   Gastrointestinal: Negative.   Endocrine: Negative.   Genitourinary: Negative.   Musculoskeletal: Positive for arthralgias. Negative for back pain.  Allergic/Immunologic: Negative.   Neurological: Negative.   Psychiatric/Behavioral: Negative.    Past Medical History  Diagnosis Date  . Hyperlipidemia     History   Social History  . Marital Status: Single    Spouse Name: N/A  . Number of Children: N/A  . Years of Education: N/A   Occupational History  . Not on file.   Social History Main Topics  . Smoking status: Never Smoker   . Smokeless tobacco: Not on file  . Alcohol Use: Yes     Comment: socially  . Drug Use: No  . Sexual Activity: Not on file   Other Topics Concern  . Not on file   Social History Narrative    Past Surgical History  Procedure Laterality Date  . Knee surgery      No family history on file.  Allergies  Allergen Reactions  . Sulfa Antibiotics     Current Outpatient Prescriptions on File Prior to Visit  Medication Sig Dispense Refill  . HYDROcodone-acetaminophen (NORCO/VICODIN) 5-325 MG per tablet Take 1 tablet by mouth every 4 (four) hours as needed. (Patient not taking: Reported on 10/18/2014) 12 tablet 0   No current facility-administered  medications on file prior to visit.    BP 120/80 mmHg  Pulse 64  Temp(Src) 97.9 F (36.6 C) (Oral)  Wt 257 lb (116.574 kg)chart    Objective:   Physical Exam  Constitutional: She is oriented to person, place, and time. She appears well-developed and well-nourished.  HENT:  Right Ear: External ear normal.  Left Ear: External ear normal.  Nose: Nose normal.  Mouth/Throat: Oropharynx is clear and moist.  Neck: Normal range of motion. Neck supple.  Cardiovascular: Normal rate, regular rhythm and normal heart sounds.   Pulmonary/Chest: Effort normal and breath sounds normal.  Abdominal: Soft. Bowel sounds are normal.  Musculoskeletal: Normal range of motion. She exhibits no edema.  Crepitus noted to the left knee  Neurological: She is oriented to person, place, and time.  Skin: Skin is warm and dry.  Psychiatric: She has a normal mood and affect.          Assessment & Plan:  Mahiya was seen today for follow-up.  Diagnoses and all orders for this visit:  Essential hypertension Orders: -     CMP  Obesity Orders: -     CMP -     TSH  Primary osteoarthritis of left knee  Other orders -     hydrochlorothiazide (HYDRODIURIL) 12.5 MG tablet; Take 1 tablet (12.5 mg total) by mouth daily. -     meloxicam (MOBIC) 15 MG tablet; Take 1 tablet (15 mg total) by mouth daily. -  phentermine (ADIPEX-P) 37.5 MG tablet; Take 1 tablet (37.5 mg total) by mouth daily before breakfast.   Continue current medications. Encouraged healthy diet and exercise. Encouraged weight reduction. Recheck weight in 30 days. Call the office with any questions or concerns. Labs obtained today will notify patient pending results

## 2015-02-02 ENCOUNTER — Encounter: Payer: Self-pay | Admitting: Family

## 2015-02-02 ENCOUNTER — Ambulatory Visit (INDEPENDENT_AMBULATORY_CARE_PROVIDER_SITE_OTHER): Payer: BC Managed Care – PPO | Admitting: Family

## 2015-02-02 ENCOUNTER — Telehealth: Payer: Self-pay | Admitting: Family

## 2015-02-02 VITALS — BP 150/100 | HR 80 | Temp 99.0°F | Ht 66.0 in | Wt 258.4 lb

## 2015-02-02 DIAGNOSIS — M1712 Unilateral primary osteoarthritis, left knee: Secondary | ICD-10-CM

## 2015-02-02 DIAGNOSIS — M1711 Unilateral primary osteoarthritis, right knee: Secondary | ICD-10-CM | POA: Diagnosis not present

## 2015-02-02 DIAGNOSIS — I10 Essential (primary) hypertension: Secondary | ICD-10-CM

## 2015-02-02 MED ORDER — HYDROCHLOROTHIAZIDE 12.5 MG PO TABS: 12.5000 mg | ORAL_TABLET | Freq: Every day | ORAL | Status: DC

## 2015-02-02 NOTE — Patient Instructions (Signed)
Low-Sodium Eating Plan °Sodium raises blood pressure and causes water to be held in the body. Getting less sodium from food will help lower your blood pressure, reduce any swelling, and protect your heart, liver, and kidneys. We get sodium by adding salt (sodium chloride) to food. Most of our sodium comes from canned, boxed, and frozen foods. Restaurant foods, fast foods, and pizza are also very high in sodium. Even if you take medicine to lower your blood pressure or to reduce fluid in your body, getting less sodium from your food is important. °WHAT IS MY PLAN? °Most people should limit their sodium intake to 2,300 mg a day. Your health care provider recommends that you limit your sodium intake to __________ a day.  °WHAT DO I NEED TO KNOW ABOUT THIS EATING PLAN? °For the low-sodium eating plan, you will follow these general guidelines: °· Choose foods with a % Daily Value for sodium of less than 5% (as listed on the food label).   °· Use salt-free seasonings or herbs instead of table salt or sea salt.   °· Check with your health care provider or pharmacist before using salt substitutes.   °· Eat fresh foods. °· Eat more vegetables and fruits. °· Limit canned vegetables. If you do use them, rinse them well to decrease the sodium.   °· Limit cheese to 1 oz (28 g) per day.    °· Eat lower-sodium products, often labeled as "lower sodium" or "no salt added." °· Avoid foods that contain monosodium glutamate (MSG). MSG is sometimes added to Chinese food and some canned foods.   °· Check food labels (Nutrition Facts labels) on foods to learn how much sodium is in one serving. °· Eat more home-cooked food and less restaurant, buffet, and fast food.  °· When eating at a restaurant, ask that your food be prepared with less salt, or no salt if possible.   °HOW DO I READ FOOD LABELS FOR SODIUM INFORMATION? °The Nutrition Facts label lists the amount of sodium in one serving of the food. If you eat more than one serving, you  must multiply the listed amount of sodium by the number of servings. °Food labels may also identify foods as: °· Sodium free--Less than 5 mg in a serving. °· Very low sodium--35 mg or less in a serving. °· Low sodium--140 mg or less in a serving. °· Light in sodium--50% less sodium in a serving. For example, if a food that usually has 300 mg of sodium is changed to become light in sodium, it will have 150 mg of sodium. °· Reduced sodium--25% less sodium in a serving. For example, if a food that usually has 400 mg of sodium is changed to reduced sodium, it will have 300 mg of sodium. °WHAT FOODS CAN I EAT? °Grains  °Low-sodium cereals, including oats, puffed wheat and rice, and shredded wheat cereals. Low-sodium crackers. Unsalted rice and pasta. Lower-sodium bread.  °Vegetables  °Frozen or fresh vegetables. Low-sodium or reduced-sodium canned vegetables. Low-sodium or reduced-sodium tomato sauce and paste. Low-sodium or reduced-sodium tomato and vegetable juices.  °Fruits  °Fresh, frozen, and canned fruit. Fruit juice.  °Meat and Other Protein Products  °Low-sodium canned tuna and salmon. Fresh or frozen meat, poultry, seafood, and fish. Lamb. Unsalted nuts. Dried beans, peas, and lentils without added salt. Unsalted canned beans. Homemade soups without salt. Eggs.  °Dairy  °Milk. Soy milk. Ricotta cheese. Low-sodium or reduced-sodium cheeses. Yogurt.  °Condiments  °Fresh and dried herbs and spices. Salt-free seasonings. Onion and garlic powders. Low-sodium varieties of mustard and ketchup. Fresh or refrigerated horseradish. Lemon   juice.  °Fats and Oils   °Reduced-sodium salad dressings. Unsalted butter.   °Other  °Unsalted popcorn and pretzels.  °The items listed above may not be a complete list of recommended foods or beverages. Contact your dietitian for more options. °WHAT FOODS ARE NOT RECOMMENDED? °Grains  °Instant hot cereals. Bread stuffing, pancake, and biscuit mixes. Croutons. Seasoned rice or pasta mixes.  Noodle soup cups. Boxed or frozen macaroni and cheese. Self-rising flour. Regular salted crackers. °Vegetables  °Regular canned vegetables. Regular canned tomato sauce and paste. Regular tomato and vegetable juices. Frozen vegetables in sauces. Salted French fries. Olives. Pickles. Relishes. Sauerkraut. Salsa. °Meat and Other Protein Products  °Salted, canned, smoked, spiced, or pickled meats, seafood, or fish. Bacon, ham, sausage, hot dogs, corned beef, chipped beef, and packaged luncheon meats. Salt pork. Jerky. Pickled herring. Anchovies, regular canned tuna, and sardines. Salted nuts. °Dairy  °Processed cheese and cheese spreads. Cheese curds. Blue cheese and cottage cheese. Buttermilk.  °Condiments  °Onion and garlic salt, seasoned salt, table salt, and sea salt. Canned and packaged gravies. Worcestershire sauce. Tartar sauce. Barbecue sauce. Teriyaki sauce. Soy sauce, including reduced sodium. Steak sauce. Fish sauce. Oyster sauce. Cocktail sauce. Horseradish that you find on the shelf. Regular ketchup and mustard. Meat flavorings and tenderizers. Bouillon cubes. Hot sauce. Tabasco sauce. Marinades. Taco seasonings. Relishes. °Fats and Oils   °Regular salad dressings. Salted butter. Margarine. Ghee. Bacon fat.  °Other  °Potato and tortilla chips. Corn chips and puffs. Salted popcorn and pretzels. Canned or dried soups. Pizza. Frozen entrees and pot pies.   °The items listed above may not be a complete list of foods and beverages to avoid. Contact your dietitian for more information. °  °This information is not intended to replace advice given to you by your health care provider. Make sure you discuss any questions you have with your health care provider. °  °Document Released: 10/04/2001 Document Revised: 05/05/2014 Document Reviewed: 02/16/2013 °Elsevier Interactive Patient Education ©2016 [employer] ° °

## 2015-02-02 NOTE — Progress Notes (Signed)
   Subjective:    Patient ID: Sandra Shea, female    DOB: 09-01-1965, 49 y.o.   MRN: [id number]  HPI 49 year old African-American female, nonsmoker with a history of bilateral osteoarthritis of the knees currently under the care of orthopedics, and hypertension not currently on hydrochlorothiazide is in today for recheck. She has been off of her blood pressure medication for about 3-4 weeks. Blood pressure running in the 573U over 202 systolically. Reports occasional headache. No blurred vision or double vision.   Review of Systems  Constitutional: Negative.   HENT: Negative.   Respiratory: Negative.   Cardiovascular: Negative.   Gastrointestinal: Negative.   Endocrine: Negative.   Genitourinary: Negative.   Musculoskeletal: Positive for arthralgias.       Bilateral knee pain   Skin: Negative.   Allergic/Immunologic: Negative.   Neurological: Negative.   Psychiatric/Behavioral: Negative.    Past Medical History  Diagnosis Date  . Hyperlipidemia     Social History   Social History  . Marital Status: Single    Spouse Name: N/A  . Number of Children: N/A  . Years of Education: N/A   Occupational History  . Not on file.   Social History Main Topics  . Smoking status: Never Smoker   . Smokeless tobacco: Not on file  . Alcohol Use: Yes     Comment: socially  . Drug Use: No  . Sexual Activity: Not on file   Other Topics Concern  . Not on file   Social History Narrative    Past Surgical History  Procedure Laterality Date  . Knee surgery      No family history on file.  Allergies  Allergen Reactions  . Sulfa Antibiotics     Current Outpatient Prescriptions on File Prior to Visit  Medication Sig Dispense Refill  . meloxicam (MOBIC) 15 MG tablet Take 1 tablet (15 mg total) by mouth daily. (Patient not taking: Reported on 02/02/2015) 30 tablet 4   No current facility-administered medications on file prior to visit.    BP 150/100 mmHg  Pulse 80  Temp(Src) 99  F (37.2 C) (Oral)  Ht 5\' 6"  (1.676 m)  Wt 258 lb 7 oz (117.226 kg)  BMI 41.73 kg/m2  SpO2 97%  LMP 07/24/2014chart.    Objective:   Physical Exam  Constitutional: She is oriented to person, place, and time. She appears well-developed and well-nourished.  HENT:  Right Ear: External ear normal.  Left Ear: External ear normal.  Nose: Nose normal.  Mouth/Throat: Oropharynx is clear and moist.  Neck: Normal range of motion. Neck supple. No thyromegaly present.  Cardiovascular: Normal rate, regular rhythm and normal heart sounds.   Pulmonary/Chest: Effort normal and breath sounds normal.  Abdominal: Soft. Bowel sounds are normal.  Musculoskeletal: Normal range of motion.  Neurological: She is alert and oriented to person, place, and time.  Skin: Skin is warm and dry.  Psychiatric: She has a normal mood and affect.          Assessment & Plan:  Diagnoses and all orders for this visit:  Essential hypertension  Primary osteoarthritis of right knee  Primary osteoarthritis of left knee  Other orders -     hydrochlorothiazide (HYDRODIURIL) 12.5 MG tablet; Take 1 tablet (12.5 mg total) by mouth daily.   Encourage medication compliance. Resume Hodge or thiazide 12.5 mg once daily. Low-sodium diet. Follow with orthopedics as scheduled.

## 2015-02-02 NOTE — Telephone Encounter (Signed)
Pt forgot to ask you at her visit if she can have a refill on her Phentermine please.

## 2015-02-02 NOTE — Progress Notes (Signed)
Pre visit review using our clinic review tool, if applicable. No additional management support is needed unless otherwise documented below in the visit note. 

## 2015-02-05 NOTE — Telephone Encounter (Signed)
No, she never comes back for follow-up weight. We cannot evaluate risk vs benefits

## 2015-02-06 NOTE — Telephone Encounter (Signed)
Spoke to patient. Patient states she wants to continue working on lowering her BP before scheduling appointment. Will call office when feels is ready to make appointment

## 2015-05-13 ENCOUNTER — Encounter (HOSPITAL_BASED_OUTPATIENT_CLINIC_OR_DEPARTMENT_OTHER): Payer: Self-pay | Admitting: Emergency Medicine

## 2015-05-13 ENCOUNTER — Emergency Department (HOSPITAL_BASED_OUTPATIENT_CLINIC_OR_DEPARTMENT_OTHER)
Admission: EM | Admit: 2015-05-13 | Discharge: 2015-05-13 | Disposition: A | Discharge status: Home / Self Care | Payer: BC Managed Care – PPO | Attending: Emergency Medicine | Admitting: Emergency Medicine

## 2015-05-13 ENCOUNTER — Emergency Department (HOSPITAL_BASED_OUTPATIENT_CLINIC_OR_DEPARTMENT_OTHER): Payer: BC Managed Care – PPO

## 2015-05-13 DIAGNOSIS — S3992XA Unspecified injury of lower back, initial encounter: Secondary | ICD-10-CM | POA: Diagnosis present

## 2015-05-13 DIAGNOSIS — S39012A Strain of muscle, fascia and tendon of lower back, initial encounter: Secondary | ICD-10-CM | POA: Diagnosis not present

## 2015-05-13 DIAGNOSIS — Y92410 Unspecified street and highway as the place of occurrence of the external cause: Secondary | ICD-10-CM | POA: Insufficient documentation

## 2015-05-13 DIAGNOSIS — Y9389 Activity, other specified: Secondary | ICD-10-CM | POA: Insufficient documentation

## 2015-05-13 DIAGNOSIS — Y998 Other external cause status: Secondary | ICD-10-CM | POA: Diagnosis not present

## 2015-05-13 DIAGNOSIS — S0990XA Unspecified injury of head, initial encounter: Secondary | ICD-10-CM | POA: Insufficient documentation

## 2015-05-13 DIAGNOSIS — I10 Essential (primary) hypertension: Secondary | ICD-10-CM | POA: Diagnosis not present

## 2015-05-13 HISTORY — DX: Essential (primary) hypertension: I10

## 2015-05-13 MED ORDER — NAPROXEN 500 MG PO TABS: 500.0000 mg | ORAL_TABLET | Freq: Two times a day (BID) | ORAL | Status: DC
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
## 2015-05-13 NOTE — ED Notes (Signed)
MD at bedside. 
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
## 2015-05-13 NOTE — ED Notes (Signed)
Presents with lower back pain. Radiates to mid back, also radiates to left buttocks and to LLE

## 2015-05-13 NOTE — ED Notes (Signed)
Denies any loss of consciousness also denies any neck pain or arm pain

## 2015-05-13 NOTE — ED Notes (Signed)
Denies any numbness or tingling in legs.

## 2015-05-13 NOTE — Discharge Instructions (Signed)

## 2015-08-15 MED FILL — PHENAZOPYRIDINE 200 MG TAB: 200 | 10 days supply | Qty: 30 | Fill #0

## 2015-08-15 MED FILL — CEPHALEXIN 500 MG CAPSULE: 500 | 7 days supply | Qty: 14 | Fill #0

## 2015-08-19 ENCOUNTER — Encounter (HOSPITAL_BASED_OUTPATIENT_CLINIC_OR_DEPARTMENT_OTHER): Payer: Self-pay | Admitting: *Deleted

## 2015-08-19 ENCOUNTER — Emergency Department (HOSPITAL_BASED_OUTPATIENT_CLINIC_OR_DEPARTMENT_OTHER)
Admission: EM | Admit: 2015-08-19 | Discharge: 2015-08-19 | Disposition: A | Discharge status: Home / Self Care | Payer: BC Managed Care – PPO | Attending: Emergency Medicine | Admitting: Emergency Medicine

## 2015-08-19 DIAGNOSIS — Z791 Long term (current) use of non-steroidal anti-inflammatories (NSAID): Secondary | ICD-10-CM | POA: Insufficient documentation

## 2015-08-19 DIAGNOSIS — Z3202 Encounter for pregnancy test, result negative: Secondary | ICD-10-CM | POA: Insufficient documentation

## 2015-08-19 DIAGNOSIS — N3091 Cystitis, unspecified with hematuria: Secondary | ICD-10-CM

## 2015-08-19 DIAGNOSIS — Z792 Long term (current) use of antibiotics: Secondary | ICD-10-CM | POA: Insufficient documentation

## 2015-08-19 DIAGNOSIS — I10 Essential (primary) hypertension: Secondary | ICD-10-CM | POA: Insufficient documentation

## 2015-08-19 DIAGNOSIS — Z79899 Other long term (current) drug therapy: Secondary | ICD-10-CM | POA: Insufficient documentation

## 2015-08-19 DIAGNOSIS — N3090 Cystitis, unspecified without hematuria: Secondary | ICD-10-CM | POA: Insufficient documentation

## 2015-08-19 DIAGNOSIS — R109 Unspecified abdominal pain: Secondary | ICD-10-CM | POA: Diagnosis present

## 2015-08-19 LAB — CBC WITH DIFFERENTIAL/PLATELET
Basophils Absolute: 0 10*3/uL (ref 0.0–0.1)
Basophils Relative: 0 %
Eosinophils Absolute: 0.1 10*3/uL (ref 0.0–0.7)
Eosinophils Relative: 1 %
HCT: 42.3 % (ref 36.0–46.0)
Hemoglobin: 13.6 g/dL (ref 12.0–15.0)
Lymphocytes Relative: 37 %
Lymphs Abs: 2.9 10*3/uL (ref 0.7–4.0)
MCH: 25.8 pg — ABNORMAL LOW (ref 26.0–34.0)
MCHC: 32.2 g/dL (ref 30.0–36.0)
MCV: 80.1 fL (ref 78.0–100.0)
Monocytes Absolute: 0.6 10*3/uL (ref 0.1–1.0)
Monocytes Relative: 8 %
Neutro Abs: 4.3 10*3/uL (ref 1.7–7.7)
Neutrophils Relative %: 54 %
Platelets: 231 10*3/uL (ref 150–400)
RBC: 5.28 MIL/uL — ABNORMAL HIGH (ref 3.87–5.11)
RDW: 13.9 % (ref 11.5–15.5)
WBC: 7.9 10*3/uL (ref 4.0–10.5)

## 2015-08-19 LAB — LIPASE, BLOOD: Lipase: 16 U/L (ref 11–51)

## 2015-08-19 LAB — COMPREHENSIVE METABOLIC PANEL
ALT: 13 U/L — ABNORMAL LOW (ref 14–54)
AST: 16 U/L (ref 15–41)
Albumin: 4.4 g/dL (ref 3.5–5.0)
Alkaline Phosphatase: 69 U/L (ref 38–126)
Anion gap: 10 (ref 5–15)
BUN: 15 mg/dL (ref 6–20)
CO2: 21 mmol/L — ABNORMAL LOW (ref 22–32)
Calcium: 9.3 mg/dL (ref 8.9–10.3)
Chloride: 107 mmol/L (ref 101–111)
Creatinine, Ser: 1.02 mg/dL — ABNORMAL HIGH (ref 0.44–1.00)
GFR calc Af Amer: >60 mL/min (ref >60)
GFR calc non Af Amer: >60 mL/min (ref >60)
Glucose, Bld: 109 mg/dL — ABNORMAL HIGH (ref 65–99)
Potassium: 3.9 mmol/L (ref 3.5–5.1)
Sodium: 138 mmol/L (ref 135–145)
Total Bilirubin: 0.9 mg/dL (ref 0.3–1.2)
Total Protein: 8 g/dL (ref 6.5–8.1)

## 2015-08-19 LAB — URINALYSIS, ROUTINE W REFLEX MICROSCOPIC
BILIRUBIN URINE: NEGATIVE
Glucose, UA: NEGATIVE mg/dL
KETONES UR: 15 mg/dL — AB
Leukocytes, UA: NEGATIVE
Nitrite: NEGATIVE
PH: 6 (ref 5.0–8.0)
Protein, ur: >300 mg/dL — AB
Specific Gravity, Urine: 1.03 (ref 1.005–1.030)

## 2015-08-19 LAB — WET PREP, GENITAL
Clue Cells Wet Prep HPF POC: NONE SEEN
Sperm: NONE SEEN
Trich, Wet Prep: NONE SEEN
Yeast Wet Prep HPF POC: NONE SEEN

## 2015-08-19 LAB — URINE MICROSCOPIC-ADD ON
SQUAMOUS EPITHELIAL / LPF: NONE SEEN
WBC UA: NONE SEEN WBC/hpf (ref 0–5)

## 2015-08-19 LAB — PREGNANCY, URINE: Preg Test, Ur: NEGATIVE

## 2015-08-19 MED ORDER — ONDANSETRON HCL 4 MG/2ML IJ SOLN
4.0000 mg | Freq: Once | INTRAMUSCULAR | Status: AC | PRN
  Administered 2015-08-19: 4 mg via INTRAVENOUS
  Filled 2015-08-19: qty 2

## 2015-08-19 MED ORDER — MORPHINE SULFATE (PF) 4 MG/ML IV SOLN
4.0000 mg | Freq: Once | INTRAVENOUS | Status: AC
  Administered 2015-08-19: 4 mg via INTRAVENOUS
  Filled 2015-08-19: qty 1

## 2015-08-19 MED ORDER — DEXTROSE 5 % IV SOLN
1.0000 g | Freq: Once | INTRAVENOUS | Status: AC
  Administered 2015-08-19: 1 g via INTRAVENOUS
  Filled 2015-08-19: qty 10

## 2015-08-19 NOTE — Discharge Instructions (Signed)

## 2015-08-19 NOTE — ED Notes (Signed)
Patient c/o abd pain and constant urge to urinate since Thursday. She went to urgent care on Friday and was given a shot for pain. Pain has grown worse.

## 2015-08-19 NOTE — ED Provider Notes (Signed)
CSN: [id number]     Arrival date & time 08/19/15  1714 History   First MD Initiated Contact with Patient 08/19/15 1731     Chief Complaint  Patient presents with  . Abdominal Pain   HPI   50 year old female presents today with complaints of urinary tract infection. Patient reports approximately 10 days ago she sought care of her urologist for pain over her bladder. She felt as if she was developing a urinary tract infection as this with typical symptoms of that. She reports at that time that her urinalysis was negative, but was started on Keflex. She reports following up with her primary care provider on Wednesday and receiving a prescription for pain medication. She was also tested for menopause. She notes that pain continued to persist, she had blood in her urine at that time. She notes she called back to the nurse practitioner that she saw yesterday [facility]) and was told that her urine grew out bacteria. She was placed on pain medication and Augmentin at that time. She reports that initially when she was seen she had a CT with contrast which showed no significant findings including kidney stones or pelvic abnormalities. She was scheduled for a cystoscopy 3 days from today. Patient reports pain is sharp in nature and located over her bladder, she has frequent and urgency with very small amount of bloody urine noted. She reports the pain is 10 out of 10 when she is awake, but notes she has no pain when sleeping. She denies any vaginal bleeding or discharge. She denies any fever, nausea, vomiting, upper abdominal pain, or rectal bleeding.    Past Medical History  Diagnosis Date  . Hypertension    Past Surgical History  Procedure Laterality Date  . Knee surgery     No family history on file. Social History  Substance Use Topics  . Smoking status: Never Smoker   . Smokeless tobacco: None  . Alcohol Use: Yes     Comment: socially   OB History    Gravida Para Term Preterm AB TAB  SAB Ectopic Multiple Living   4 3             Review of Systems  All other systems reviewed and are negative.   Allergies  Sulfa antibiotics  Home Medications   Prior to Admission medications   Medication Sig Start Date End Date Taking? Authorizing Provider  amoxicillin-clavulanate (AUGMENTIN) 875-125 MG tablet Take 1 tablet by mouth 2 (two) times daily.   Yes Historical Provider, MD  cephALEXin (KEFLEX) 500 MG capsule Take 500 mg by mouth 4 (four) times daily.   Yes Historical Provider, MD  oxyCODONE-acetaminophen (PERCOCET) 7.5-325 MG tablet Take 1 tablet by mouth 2 (two) times daily.   Yes Historical Provider, MD  phenazopyridine (PYRIDIUM) 200 MG tablet Take 200 mg by mouth 3 (three) times daily as needed for pain.   Yes Historical Provider, MD  hydrochlorothiazide (HYDRODIURIL) 12.5 MG tablet Take 1 tablet (12.5 mg total) by mouth daily. 02/02/15   Kennyth Arnold, FNP  meloxicam (MOBIC) 15 MG tablet Take 1 tablet (15 mg total) by mouth daily. Patient not taking: Reported on 02/02/2015 10/18/14   Kennyth Arnold, FNP  naproxen (NAPROSYN) 500 MG tablet Take 1 tablet (500 mg total) by mouth 2 (two) times daily. 05/13/15   Ezequiel Essex, MD   BP 138/97 mmHg  Pulse 101  Temp(Src) 97.6 F (36.4 C) (Oral)  Resp 20  Ht 5\' 6"  (1.676 m)  Wt 117.935 kg  BMI 41.99 kg/m2  SpO2 99%  LMP 11/18/2012   Physical Exam  Constitutional: She is oriented to person, place, and time. She appears well-developed and well-nourished.  HENT:  Head: Normocephalic and atraumatic.  Eyes: Conjunctivae are normal. Pupils are equal, round, and reactive to light. Right eye exhibits no discharge. Left eye exhibits no discharge. No scleral icterus.  Neck: Normal range of motion. No JVD present. No tracheal deviation present.  Pulmonary/Chest: Effort normal. No stridor.  Abdominal: Soft. She exhibits no distension and no mass. There is tenderness. There is no rebound and no guarding.  Tenderness to palpation  over the bladder  Genitourinary: Vagina normal. There is no rash, tenderness, lesion or injury on the right labia. There is no rash, tenderness, lesion or injury on the left labia. No erythema, tenderness or bleeding in the vagina. No foreign body around the vagina. No signs of injury around the vagina. No vaginal discharge found.  Musculoskeletal: Normal range of motion. She exhibits no edema or tenderness.  Neurological: She is alert and oriented to person, place, and time. Coordination normal.  Psychiatric: She has a normal mood and affect. Her behavior is normal. Judgment and thought content normal.  Nursing note and vitals reviewed.     ED Course  Procedures (including critical care time) Labs Review Labs Reviewed  WET PREP, GENITAL - Abnormal; Notable for the following:    WBC, Wet Prep HPF POC MODERATE (*)    All other components within normal limits  URINALYSIS, ROUTINE W REFLEX MICROSCOPIC (NOT AT [facility]) - Abnormal; Notable for the following:    APPearance CLOUDY (*)    Hgb urine dipstick LARGE (*)    Ketones, ur 15 (*)    Protein, ur >300 (*)    All other components within normal limits  URINE MICROSCOPIC-ADD ON - Abnormal; Notable for the following:    Bacteria, UA FEW (*)    All other components within normal limits  CBC WITH DIFFERENTIAL/PLATELET - Abnormal; Notable for the following:    RBC 5.28 (*)    MCH 25.8 (*)    All other components within normal limits  COMPREHENSIVE METABOLIC PANEL - Abnormal; Notable for the following:    CO2 21 (*)    Glucose, Bld 109 (*)    Creatinine, Ser 1.02 (*)    ALT 13 (*)    All other components within normal limits  URINE CULTURE  PREGNANCY, URINE  LIPASE, BLOOD  GC/CHLAMYDIA PROBE AMP ([place]) NOT AT [facility]    Imaging Review No results found. I have personally reviewed and evaluated these images and lab results as part of my medical decision-making.   EKG Interpretation None      MDM   Final diagnoses:   Hemorrhagic cystitis    Labs:Wet prep, CBC, CMP, lipase, urinalysis-  large hemoglobin in the urine  Imaging:  Consults:  Therapeutics: Ceftriaxone, Zofran, morphine  Discharge Meds:   Assessment/Plan:50 year old female presents today with likely hemorrhagic cystitis. I still have concern for noninfectious cause of symptoms, patient has a cystoscopy on Wednesday which is 3 days from today. This is adequate time for follow-up. Patient's labs are reassuring here, she has no signs of anemia, no signs of infectious etiology. Patient's pain was managed here with above medications, she will be discharged home with instructions to continue using antibiotics, follow-up with her urologist as previously scheduled. Patient had a normal CT scan with contrast several days prior, with evidence of blood in her urine  and no evidence of vaginal bleeding this is likely coming from the bladder, have los patient for any other L the pathology in light of recent imaging studies and findings noted above. Patient will be given strict return precautions, she verbalized understanding and agreement today's plan had no further questions or concerns at the time of discharge        Okey Regal, PA-C 08/19/15 2033  Leonard Schwartz, MD 08/23/15 [phone]

## 2015-08-20 LAB — URINE CULTURE
Culture: NO GROWTH
Special Requests: NORMAL

## 2015-08-20 LAB — GC/CHLAMYDIA PROBE AMP (~~LOC~~) NOT AT ARMC
Chlamydia: NEGATIVE
Neisseria Gonorrhea: NEGATIVE

## 2015-12-22 ENCOUNTER — Encounter: Payer: Self-pay | Admitting: *Deleted

## 2016-04-24 ENCOUNTER — Telehealth (INDEPENDENT_AMBULATORY_CARE_PROVIDER_SITE_OTHER): Payer: Self-pay | Admitting: Orthopedic Surgery

## 2016-04-24 NOTE — Telephone Encounter (Signed)
Patient called asked for a call back from Dr Marlou Sa to discuss surgery. The number to contact her is [phone]

## 2016-04-25 NOTE — Telephone Encounter (Signed)
Please advise 

## 2016-04-30 ENCOUNTER — Telehealth (INDEPENDENT_AMBULATORY_CARE_PROVIDER_SITE_OTHER): Payer: Self-pay | Admitting: Orthopedic Surgery

## 2016-04-30 NOTE — Telephone Encounter (Signed)
Please advise, see previous messages please, needs a call back to discuss surgery. [phone]

## 2016-05-01 NOTE — Telephone Encounter (Signed)
See other note

## 2016-05-01 NOTE — Telephone Encounter (Signed)
IC patient and she wants to know if you think it would be beneficial for her to look at the flexogenic clinic?  I have told her what they offer, same as we do.  She wants to know if you recommend she proceed with surgery vs. Looking at flexogenics clinic?  Pls advise and I can call her, or you can either one.  Thanks.

## 2016-05-05 NOTE — Telephone Encounter (Signed)
I think surgery would be her best option even though she is young.  Her arthritis is severe.  As he stated the flexor [facility] does not really offer anything more than what we're doing minutes going to give a predictably positive outcome  pls call thx

## 2016-05-06 NOTE — Telephone Encounter (Signed)
IC pt and advised.  Appt made at her request to discuss TKA

## 2016-05-12 ENCOUNTER — Encounter (INDEPENDENT_AMBULATORY_CARE_PROVIDER_SITE_OTHER): Payer: Self-pay | Admitting: Orthopedic Surgery

## 2016-05-12 ENCOUNTER — Ambulatory Visit (INDEPENDENT_AMBULATORY_CARE_PROVIDER_SITE_OTHER): Payer: BC Managed Care – PPO | Admitting: Orthopedic Surgery

## 2016-05-12 DIAGNOSIS — M170 Bilateral primary osteoarthritis of knee: Secondary | ICD-10-CM | POA: Diagnosis not present

## 2016-05-16 NOTE — Progress Notes (Signed)
Office Visit Note   Patient: Sandra Shea           Date of Birth: 12-Sep-1965           MRN: [id number] Visit Date: 05/12/2016 Requested by: Kennyth Arnold, [address] [place], [place] [address] PCP: Kennyth Arnold, FNP  Subjective: Chief Complaint  Patient presents with  . Left Knee - Pain  . Right Knee - Pain    HPI  Sandra Shea is a 51 year old female with known bilateral knee arthritis.  Last seen 01/01/2016.  Status of the left knee is worse.  He reports constant pain with walking or any activity.  She is taking ibuprofen or Aleve for her symptoms.  Cold makes her symptoms worse.  Pain at night is also worse.  She can walk less than one city block.  She has increased her weight by 15 pounds since last seen.  She's had gel injections and cortisone injections in the past.  No pectoralis major teeth fall out.  Last Synvisc injection was 915.  She's discussing and considering total knee replacement.              Review of Systems All systems reviewed are negative as they relate to the chief complaint within the history of present illness.  Patient denies  fevers or chills.                 Review of Systems All systems reviewed are negative as they relate to the chief complaint within the history of present illness.  Patient denies  fevers or chills.    Assessment & Plan: Visit Diagnoses:  1. Primary osteoarthritis of both knees     Plan:  Impression is bilateral knee pain left worse than right.  Talk today and I think in general she is having symptoms related to the arthritis in her knees but she is young to be considering knee replacement.  Her symptoms are not incapacitating.  I think weight loss would be a better option for her at this time if possible and that may delay her need for knee replacement.  I do want to pre-approve her for Synvisc for both knees to be done in March.  I'll see her back at that time   Follow-Up Instructions: Return in about 2 months (around  07/11/2016).   Orders:  No orders of the defined types were placed in this encounter.  No orders of the defined types were placed in this encounter.     Procedures: No procedures performed   Clinical Data: No additional findings.  Objective: Vital Signs: LMP 11/18/2012   Physical Exam   Constitutional: Patient appears well-developed HEENT:  Head: Normocephalic Eyes:EOM are normal Neck: Normal range of motion Cardiovascular: Normal rate Pulmonary/chest: Effort normal Neurologic: Patient is alert Skin: Skin is warm Psychiatric: Patient has normal mood and affect    Ortho Exam both knees are examined.  She has good flexion past 90.  Mild patellofemoral crepitus bilaterally.  No effusion in either knee.  Collateral crucial ligaments are stable.  Pedal pulses palpable.  No other masses lymph and other skin changes noted in bilateral knee region.  Extensor mechanism is intact bilaterally   Specialty Comments:  No specialty comments available.  Imaging: No results found.   PMFS History: Patient Active Problem List   Diagnosis Date Noted  . Osteoarthritis of left knee 10/18/2014  . Unspecified essential hypertension 09/24/2012  . Obesity, unspecified 09/24/2012   Past Medical History:  Diagnosis Date  . Hypertension     No family history on file.  Past Surgical History:  Procedure Laterality Date  . KNEE SURGERY     Social History   Occupational History  . Not on file.   Social History Main Topics  . Smoking status: Never Smoker  . Smokeless tobacco: Not on file  . Alcohol use Yes     Comment: socially  . Drug use: No  . Sexual activity: Not on file

## 2016-06-03 ENCOUNTER — Telehealth (INDEPENDENT_AMBULATORY_CARE_PROVIDER_SITE_OTHER): Payer: Self-pay | Admitting: Radiology

## 2016-06-03 NOTE — Telephone Encounter (Signed)
IC patient and advised gel injections covered at 100% by her insurance- Synvisc One.  She has followup appt in March

## 2016-07-14 ENCOUNTER — Ambulatory Visit (INDEPENDENT_AMBULATORY_CARE_PROVIDER_SITE_OTHER): Payer: Self-pay

## 2016-07-14 ENCOUNTER — Encounter (INDEPENDENT_AMBULATORY_CARE_PROVIDER_SITE_OTHER): Payer: Self-pay | Admitting: Orthopedic Surgery

## 2016-07-14 ENCOUNTER — Encounter (INDEPENDENT_AMBULATORY_CARE_PROVIDER_SITE_OTHER): Payer: Self-pay

## 2016-07-14 ENCOUNTER — Ambulatory Visit (INDEPENDENT_AMBULATORY_CARE_PROVIDER_SITE_OTHER): Payer: BC Managed Care – PPO | Admitting: Orthopedic Surgery

## 2016-07-14 DIAGNOSIS — M170 Bilateral primary osteoarthritis of knee: Secondary | ICD-10-CM | POA: Diagnosis not present

## 2016-07-14 DIAGNOSIS — G8929 Other chronic pain: Secondary | ICD-10-CM

## 2016-07-14 DIAGNOSIS — M5442 Lumbago with sciatica, left side: Secondary | ICD-10-CM | POA: Diagnosis not present

## 2016-07-14 NOTE — Progress Notes (Signed)
Office Visit Note   Patient: Sandra Shea           Date of Birth: July 20, 1965           MRN: [id number] Visit Date: 07/14/2016 Requested by: Kennyth Arnold, [address] [place], [place] [address] PCP: Kennyth Arnold, FNP  Subjective: Chief Complaint  Patient presents with  . Lower Back - Pain  . Left Knee - Pain  . Right Knee - Pain  [name] is a 51 year old female with bilateral knee arthritis.  She was initially scheduled this received Synvisc injections today but her knees are doing well.  She states the pain is not as severe.  Last Synvisc injections 01/01/2016.  That she's had a more low back pain.  At times she has difficulty standing upright.  The left side hurts more than the right side.  She does  report radicular left leg pain.  She also describes some tingling in the left leg.  This is been going on at least 6 months.  She's been taking turmeric for her knees which is been helping.  She works as a Teacher, early years/pre.  HPI: See above              ROS: All systems reviewed are negative as they relate to the chief complaint within the history of present illness.  Patient denies  fevers or chills.   Assessment & Plan: Visit Diagnoses:  1. Chronic left-sided low back pain with left-sided sciatica   2. Primary osteoarthritis of both knees     Plan: Impression is currently relatively less symptomatic bilateral knee arthritis.  No Synvisc injections today until the knees become more symptomatic.  In regards to her back she does have facet arthritis on plain radiographs and her symptoms are consistent with facet arthritis with possible left-sided foraminal stenosis.  At this time to start her in some physical therapy for core strengthening exercises in knee flexion needed chest exercises.  If she is no better in 4 weeks we will consider MRI scanning with subsequent facet injections to follow.  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  Follow-Up Instructions: No Follow-up on file.   Orders:  No orders of the defined types were placed in this encounter.  No orders of the defined types were placed in this encounter.     Procedures: No procedures performed   Clinical Data: No additional findings.  Objective: Vital Signs: LMP 11/18/2012   Physical Exam:   Constitutional: Patient appears well-developed HEENT:  Head: Normocephalic Eyes:EOM are normal Neck: Normal range of motion Cardiovascular: Normal rate Pulmonary/chest: Effort normal Neurologic: Patient is alert Skin: Skin is warm Psychiatric: Patient has normal mood and affect    Ortho Exam: Orthopedic exam demonstrates no groin pain with internal/external rotation of the leg no other masses lymph adenopathy or skin changes noted in the groin region.  Both knees have no effusion and good range of motion and mild patellofemoral arthritis and crepitus.  Collateral cruciate ligaments are stable.  She does have pain with forward bending as well as straightening.  Specialty Comments:  No specialty comments available.  Imaging: No results found.   PMFS History: Patient Active Problem List   Diagnosis Date Noted  . Osteoarthritis of left knee 10/18/2014  . Unspecified essential hypertension 09/24/2012  . Obesity, unspecified 09/24/2012   Past Medical History:  Diagnosis Date  . Hypertension  No family history on file.  Past Surgical History:  Procedure Laterality Date  . KNEE SURGERY     Social History   Occupational History  . Not on file.   Social History Main Topics  . Smoking status: Never Smoker  . Smokeless tobacco: Never Used  . Alcohol use Yes     Comment: socially  . Drug use: No  . Sexual activity: Not on file

## 2017-01-15 ENCOUNTER — Encounter: Payer: Self-pay | Admitting: Obstetrics and Gynecology

## 2017-07-07 ENCOUNTER — Ambulatory Visit (INDEPENDENT_AMBULATORY_CARE_PROVIDER_SITE_OTHER): Payer: BC Managed Care – PPO | Admitting: Orthopedic Surgery

## 2017-07-07 ENCOUNTER — Telehealth (INDEPENDENT_AMBULATORY_CARE_PROVIDER_SITE_OTHER): Payer: Self-pay

## 2017-07-07 ENCOUNTER — Ambulatory Visit (INDEPENDENT_AMBULATORY_CARE_PROVIDER_SITE_OTHER): Payer: BC Managed Care – PPO

## 2017-07-07 DIAGNOSIS — M170 Bilateral primary osteoarthritis of knee: Secondary | ICD-10-CM

## 2017-07-07 DIAGNOSIS — M1712 Unilateral primary osteoarthritis, left knee: Secondary | ICD-10-CM | POA: Diagnosis not present

## 2017-07-07 DIAGNOSIS — M1711 Unilateral primary osteoarthritis, right knee: Secondary | ICD-10-CM | POA: Diagnosis not present

## 2017-07-07 NOTE — Telephone Encounter (Signed)
Can we see about getting her approved for synvisc one injection

## 2017-07-07 NOTE — Telephone Encounter (Signed)
Submitted application online for SynviscOne, bilateral knee. 

## 2017-07-08 ENCOUNTER — Encounter (INDEPENDENT_AMBULATORY_CARE_PROVIDER_SITE_OTHER): Payer: Self-pay | Admitting: Orthopedic Surgery

## 2017-07-08 DIAGNOSIS — M1711 Unilateral primary osteoarthritis, right knee: Secondary | ICD-10-CM | POA: Diagnosis not present

## 2017-07-08 DIAGNOSIS — M1712 Unilateral primary osteoarthritis, left knee: Secondary | ICD-10-CM | POA: Diagnosis not present

## 2017-07-08 MED ORDER — TRIAMCINOLONE ACETONIDE 40 MG/ML IJ SUSP
40.0000 mg | INTRAMUSCULAR | Status: AC | PRN
  Administered 2017-07-08: 40 mg via INTRA_ARTICULAR

## 2017-07-08 MED ORDER — LIDOCAINE HCL 1 % IJ SOLN
5.0000 mL | INTRAMUSCULAR | Status: AC | PRN
  Administered 2017-07-08: 5 mL

## 2017-07-08 MED ORDER — BUPIVACAINE HCL 0.25 % IJ SOLN
4.0000 mL | INTRAMUSCULAR | Status: AC | PRN
  Administered 2017-07-08: 4 mL via INTRA_ARTICULAR

## 2017-07-08 NOTE — Progress Notes (Signed)
Office Visit Note   Patient: Sandra Shea           Date of Birth: 21-Oct-1965           MRN: [id number] Visit Date: 07/07/2017 Requested by: Kennyth Arnold, [address] [place], [place] [address] PCP: Kennyth Arnold, FNP  Subjective: Chief Complaint  Patient presents with  . Left Knee - Pain  . Right Knee - Pain    HPI: Sandra Shea is a patient with bilateral knee pain right worse than left.  She has known history of bilateral knee arthritis.  She reports weakness and giving way.  Her mother recently passed away and she had to wear high heels and that has aggravated her right knee.  She was originally going to get gel injections in March of last year but her knees were doing recently well at the time.  Denies any mechanical symptoms in either knee but just reports pain with ambulation              ROS: All systems reviewed are negative as they relate to the chief complaint within the history of present illness.  Patient denies  fevers or chills. Impression is bilateral knee arthritis  Assessment & Plan: Visit Diagnoses:  1. Bilateral primary osteoarthritis of knee     Plan: With worsening clinical symptoms.  End-stage arthritis is present on radiographs.  Plan is bilateral knee injections today and we will pre-approve her for gel injection.  She needs to decide about whether or not she wants knee replacement surgery in the future.  Follow-up with me as needed.  Follow-Up Instructions: Return if symptoms worsen or fail to improve.   Orders:  Orders Placed This Encounter  Procedures  . XR KNEE 3 VIEW RIGHT  . XR KNEE 3 VIEW LEFT   No orders of the defined types were placed in this encounter.     Procedures: Large Joint Inj: bilateral knee on 07/08/2017 9:32 AM Indications: diagnostic evaluation, joint swelling and pain Details: 18 G 1.5 in needle, superolateral approach  Arthrogram: No  Medications (Right): 5 mL lidocaine 1 %; 40 mg triamcinolone acetonide 40 MG/ML; 4 mL  bupivacaine 0.25 % Medications (Left): 5 mL lidocaine 1 %; 40 mg triamcinolone acetonide 40 MG/ML; 4 mL bupivacaine 0.25 % Outcome: tolerated well, no immediate complications Procedure, treatment alternatives, risks and benefits explained, specific risks discussed. Consent was given by the patient. Immediately prior to procedure a time out was called to verify the correct patient, procedure, equipment, support staff and site/side marked as required. Patient was prepped and draped in the usual sterile fashion.       Clinical Data: No additional findings.  Objective: Vital Signs: LMP 11/18/2012   Physical Exam:   Constitutional: Patient appears well-developed HEENT:  Head: Normocephalic Eyes:EOM are normal Neck: Normal range of motion Cardiovascular: Normal rate Pulmonary/chest: Effort normal Neurologic: Patient is alert Skin: Skin is warm Psychiatric: Patient has normal mood and affect    Ortho Exam: Orthopedic exam demonstrates varus alignment and lumbering gait.  Pedal pulses palpable.  Body mass index increased.  No masses lymph adenopathy or skin changes noted in the knee region.  Collateral and cruciate ligaments are stable.  No groin pain with internal/external rotation of the leg.  No other masses lymphadenopathy or skin changes noted in the knee region.  Range of motion demonstrates flexion contracture of about 10 degrees in both knees with flexion past 90 in both knees but just barely.  The  Specialty Comments:  No specialty comments available.  Imaging: Xr Knee 3 View Left  Result Date: 07/08/2017 AP lateral merchant left knee reviewed.  Varus alignment is present.  End-stage tricompartmental arthritis is noted in all 3 compartments.  No fracture or dislocation present.  Bone quality appears reasonable.  Patella is located.  Xr Knee 3 View Right  Result Date: 07/08/2017 AP lateral merchant right knee reviewed.  Varus alignment is present.  Severe end-stage  tricompartmental arthritis is also noted with no current fractures or dislocations.  Bone density appears reasonable.  Patella is located.    PMFS History: Patient Active Problem List   Diagnosis Date Noted  . Osteoarthritis of left knee 10/18/2014  . Unspecified essential hypertension 09/24/2012  . Obesity, unspecified 09/24/2012   Past Medical History:  Diagnosis Date  . Hypertension     No family history on file.  Past Surgical History:  Procedure Laterality Date  . KNEE SURGERY     Social History   Occupational History  . Not on file  Tobacco Use  . Smoking status: Never Smoker  . Smokeless tobacco: Never Used  Substance and Sexual Activity  . Alcohol use: Yes    Comment: socially  . Drug use: No  . Sexual activity: Not on file

## 2017-07-24 ENCOUNTER — Telehealth (INDEPENDENT_AMBULATORY_CARE_PROVIDER_SITE_OTHER): Payer: Self-pay

## 2017-07-24 NOTE — Telephone Encounter (Signed)
Faxed completed PA form to [phone].

## 2017-07-28 ENCOUNTER — Telehealth (INDEPENDENT_AMBULATORY_CARE_PROVIDER_SITE_OTHER): Payer: Self-pay

## 2017-07-28 NOTE — Telephone Encounter (Signed)
Talked with patient and advised her that authorization was approved for SynviscOne, bilateral knee.    [place] Patient will be covered at 100% after co-pay has been paid. Copay -$80.00  Appt.scheduled for 08/06/17 with Dr. Marlou Sa

## 2017-08-06 ENCOUNTER — Encounter (INDEPENDENT_AMBULATORY_CARE_PROVIDER_SITE_OTHER): Payer: Self-pay | Admitting: Orthopedic Surgery

## 2017-08-06 ENCOUNTER — Ambulatory Visit (INDEPENDENT_AMBULATORY_CARE_PROVIDER_SITE_OTHER): Payer: BC Managed Care – PPO | Admitting: Orthopedic Surgery

## 2017-08-06 DIAGNOSIS — M1712 Unilateral primary osteoarthritis, left knee: Secondary | ICD-10-CM

## 2017-08-06 DIAGNOSIS — M1711 Unilateral primary osteoarthritis, right knee: Secondary | ICD-10-CM

## 2017-08-06 MED ORDER — LIDOCAINE HCL 1 % IJ SOLN
5.0000 mL | INTRAMUSCULAR | Status: AC | PRN
Start: 2017-08-06 — End: 2017-08-06
  Administered 2017-08-06: 5 mL

## 2017-08-06 MED ORDER — LIDOCAINE HCL 1 % IJ SOLN
5.0000 mL | INTRAMUSCULAR | Status: AC | PRN
  Administered 2017-08-06: 5 mL

## 2017-08-06 MED ORDER — HYLAN G-F 20 48 MG/6ML IX SOSY
48.0000 mg | PREFILLED_SYRINGE | INTRA_ARTICULAR | Status: AC | PRN
  Administered 2017-08-06: 48 mg via INTRA_ARTICULAR

## 2017-08-06 NOTE — Progress Notes (Signed)
   Procedure Note  Patient: Sandra Shea             Date of Birth: [birth year]           MRN: [id number]             Visit Date: 08/06/2017  Procedures: Visit Diagnoses: Unilateral primary osteoarthritis, right knee  Unilateral primary osteoarthritis, left knee  Large Joint Inj: bilateral knee on 08/06/2017 12:09 PM Indications: diagnostic evaluation, joint swelling and pain Details: 18 G 1.5 in needle, superolateral approach  Arthrogram: No  Medications (Right): 5 mL lidocaine 1 %; 48 mg Hylan 48 MG/6ML Medications (Left): 5 mL lidocaine 1 %; 48 mg Hylan 48 MG/6ML Outcome: tolerated well, no immediate complications Procedure, treatment alternatives, risks and benefits explained, specific risks discussed. Consent was given by the patient. Immediately prior to procedure a time out was called to verify the correct patient, procedure, equipment, support staff and site/side marked as required. Patient was prepped and draped in the usual sterile fashion.
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
## 2018-09-09 ENCOUNTER — Ambulatory Visit (INDEPENDENT_AMBULATORY_CARE_PROVIDER_SITE_OTHER): Payer: BC Managed Care – PPO | Admitting: Orthopedic Surgery

## 2018-09-09 ENCOUNTER — Encounter: Payer: Self-pay | Admitting: Orthopedic Surgery

## 2018-09-09 ENCOUNTER — Other Ambulatory Visit: Payer: Self-pay

## 2018-09-09 DIAGNOSIS — M1711 Unilateral primary osteoarthritis, right knee: Secondary | ICD-10-CM

## 2018-09-09 NOTE — Progress Notes (Signed)
Office Visit Note   Patient: Sandra Shea           Date of Birth: [birth year]           MRN: [id number] Visit Date: 09/09/2018 Requested by: Kennyth Arnold, [place], [place] [address] PCP: Kennyth Arnold, FNP  Subjective: Chief Complaint  Patient presents with  . Right Knee - Follow-up    HPI: [name] is a 53 year old patient with severe bilateral knee pain right worse than left.  She describes night pain rest pain and pain which interferes with her activities of daily living.  She has had multiple series of cortisone and gel injections without sustained relief.  She drives a school bus.  No personal or family history of DVT or pulmonary embolism.  She lives in a single-story abode with father and her children at home.              ROS: All systems reviewed are negative as they relate to the chief complaint within the history of present illness.  Patient denies  fevers or chills.   Assessment & Plan: Visit Diagnoses:  1. Unilateral primary osteoarthritis, right knee     Plan: Impression is end-stage right knee arthritis with varus alignment.  Plan is right total knee replacement.  Risk and benefits are discussed including but limited to infection nerve vessel damage high likelihood of revision in her lifetime as well as incomplete pain relief.  Patient understands risk and benefits and wishes to proceed.  I would try to use press-fit prosthesis in her case due to increased body mass index and young age.  Patient understands the risk and benefits and wishes to proceed.  All questions answered  Follow-Up Instructions: No follow-ups on file.   Orders:  No orders of the defined types were placed in this encounter.  No orders of the defined types were placed in this encounter.     Procedures: No procedures performed   Clinical Data: No additional findings.  Objective: Vital Signs: LMP 11/18/2012   Physical Exam:   Constitutional: Patient appears  well-developed HEENT:  Head: Normocephalic Eyes:EOM are normal Neck: Normal range of motion Cardiovascular: Normal rate Pulmonary/chest: Effort normal Neurologic: Patient is alert Skin: Skin is warm Psychiatric: Patient has normal mood and affect    Ortho Exam: Ortho exam demonstrates full active and passive range of motion of the hips and ankles.  Both knees lack about 5 degrees of full extension.  Flexion is easily past 90 bilaterally with medial greater than lateral joint line tenderness.  No masses lymphadenopathy or skin changes noted in the knee region.  Patient does have varus alignment bilaterally.  No effusion today.  Specialty Comments:  No specialty comments available.  Imaging: No results found.   PMFS History: Patient Active Problem List   Diagnosis Date Noted  . Osteoarthritis of left knee 10/18/2014  . Unspecified essential hypertension 09/24/2012  . Obesity, unspecified 09/24/2012   Past Medical History:  Diagnosis Date  . Hypertension     History reviewed. No pertinent family history.  Past Surgical History:  Procedure Laterality Date  . KNEE SURGERY     Social History   Occupational History  . Not on file  Tobacco Use  . Smoking status: Never Smoker  . Smokeless tobacco: Never Used  Substance and Sexual Activity  . Alcohol use: Yes    Comment: socially  . Drug use: No  . Sexual activity: Not on file

## 2018-10-28 ENCOUNTER — Other Ambulatory Visit: Payer: Self-pay

## 2018-11-05 NOTE — Pre-Procedure Instructions (Signed)
[facility] - [place], [place] - [address] [place] [address] Phone: [phone] Fax: [phone]  [place], [place] [address] Phone: [phone] Fax: [phone]      Your procedure is scheduled on Thursday, July 16th.  Report to [facility] Main Entrance "A" at 10:15 A.M., and check in at the Admitting office.  Call this number if you have problems the morning of surgery:  [phone]  Call [phone] if you have any questions prior to your surgery date Monday-Friday 8am-4pm    Remember:  Do not eat after midnight the night before your surgery  You may drink clear liquids until 9:15 A.M. the morning of your surgery.   Clear liquids allowed are: Water, Non-Citrus Juices (without pulp), Carbonated Beverages, Clear Tea, Black Coffee Only, and Gatorade  Please complete your PRE-SURGERY ENSURE that was provided to you by 9:15 A.M. the morning of surgery.  Please, if able, drink it in one setting. DO NOT SIP.   Take these medicines the morning of surgery with A SIP OF WATER  acetaminophen (TYLENOL)-as needed for pain.  As of today, STOP taking any Aspirin (unless otherwise instructed by your surgeon), Aleve, Naproxen, Ibuprofen, Motrin, Advil, Goody's, BC's, all herbal medications, fish oil, and all vitamins.   The Morning of Surgery  Do not wear jewelry, make-up or nail polish.  Do not wear lotions, powders, or perfumes/colognes, or deodorant  Do not shave 48 hours prior to surgery.    Do not bring valuables to the hospital.  [facility] is not responsible for any belongings or valuables.  If you are a smoker, DO NOT Smoke 24 hours prior to surgery IF you wear a CPAP at night please bring your mask, tubing, and machine the morning of surgery   Remember that you must have someone to transport you home after your surgery, and remain with you for 24 hours  if you are discharged the same day.   Contacts, glasses, hearing aids, dentures or bridgework may not be worn into surgery.    Leave your suitcase in the car.  After surgery it may be brought to your room.  For patients admitted to the hospital, discharge time will be determined by your treatment team.  Patients discharged the day of surgery will not be allowed to drive home.    Special instructions:   [facility]- Preparing For Surgery  Before surgery, you can play an important role. Because skin is not sterile, your skin needs to be as free of germs as possible. You can reduce the number of germs on your skin by washing with CHG (chlorahexidine gluconate) Soap before surgery.  CHG is an antiseptic cleaner which kills germs and bonds with the skin to continue killing germs even after washing.    Oral Hygiene is also important to reduce your risk of infection.  Remember - BRUSH YOUR TEETH THE MORNING OF SURGERY WITH YOUR REGULAR TOOTHPASTE  Please do not use if you have an allergy to CHG or antibacterial soaps. If your skin becomes reddened/irritated stop using the CHG.  Do not shave (including legs and underarms) for at least 48 hours prior to first CHG shower. It is OK to shave your face.  Please follow these instructions carefully.   1. Shower the NIGHT BEFORE SURGERY and the MORNING OF SURGERY with CHG Soap.   2. If you chose to  wash your hair, wash your hair first as usual with your normal shampoo.  3. After you shampoo, rinse your hair and body thoroughly to remove the shampoo.  4. Use CHG as you would any other liquid soap. You can apply CHG directly to the skin and wash gently with a scrungie or a clean washcloth.   5. Apply the CHG Soap to your body ONLY FROM THE NECK DOWN.  Do not use on open wounds or open sores. Avoid contact with your eyes, ears, mouth and genitals (private parts). Wash Face and genitals (private parts)  with your normal soap.   6. Wash thoroughly,  paying special attention to the area where your surgery will be performed.  7. Thoroughly rinse your body with warm water from the neck down.  8. DO NOT shower/wash with your normal soap after using and rinsing off the CHG Soap.  9. Pat yourself dry with a CLEAN TOWEL.  10. Wear CLEAN PAJAMAS to bed the night before surgery, wear comfortable clothes the morning of surgery  11. Place CLEAN SHEETS on your bed the night of your first shower and DO NOT SLEEP WITH PETS.    Day of Surgery:  Do not apply any deodorants/lotions. Please shower the morning of surgery with the CHG soap  Please wear clean clothes to the hospital/surgery center.   Remember to brush your teeth WITH YOUR REGULAR TOOTHPASTE.   Please read over the following fact sheets that you were given.

## 2018-11-08 ENCOUNTER — Other Ambulatory Visit: Payer: Self-pay

## 2018-11-08 ENCOUNTER — Encounter (HOSPITAL_COMMUNITY): Payer: Self-pay

## 2018-11-08 ENCOUNTER — Other Ambulatory Visit (HOSPITAL_COMMUNITY)
Admission: RE | Admit: 2018-11-08 | Discharge: 2018-11-08 | Disposition: A | Discharge status: Home / Self Care | Payer: BC Managed Care – PPO | Source: Ambulatory Visit

## 2018-11-08 ENCOUNTER — Encounter (HOSPITAL_COMMUNITY)
Admission: RE | Admit: 2018-11-08 | Discharge: 2018-11-08 | Disposition: A | Discharge status: Home / Self Care | Payer: BC Managed Care – PPO | Source: Ambulatory Visit | Attending: Orthopedic Surgery | Admitting: Orthopedic Surgery

## 2018-11-08 DIAGNOSIS — Z1159 Encounter for screening for other viral diseases: Secondary | ICD-10-CM | POA: Diagnosis not present

## 2018-11-08 DIAGNOSIS — Z01812 Encounter for preprocedural laboratory examination: Secondary | ICD-10-CM | POA: Insufficient documentation

## 2018-11-08 DIAGNOSIS — M1711 Unilateral primary osteoarthritis, right knee: Secondary | ICD-10-CM | POA: Insufficient documentation

## 2018-11-08 LAB — URINALYSIS, ROUTINE W REFLEX MICROSCOPIC
Bilirubin Urine: NEGATIVE
Glucose, UA: NEGATIVE mg/dL
Ketones, ur: NEGATIVE mg/dL
Nitrite: NEGATIVE
Protein, ur: NEGATIVE mg/dL
Specific Gravity, Urine: 1.024 (ref 1.005–1.030)
pH: 5 (ref 5.0–8.0)

## 2018-11-08 LAB — BASIC METABOLIC PANEL
Anion gap: 11 (ref 5–15)
BUN: 9 mg/dL (ref 6–20)
CO2: 23 mmol/L (ref 22–32)
Calcium: 9.2 mg/dL (ref 8.9–10.3)
Chloride: 102 mmol/L (ref 98–111)
Creatinine, Ser: 1.02 mg/dL — ABNORMAL HIGH (ref 0.44–1.00)
GFR calc Af Amer: >60 mL/min (ref >60)
GFR calc non Af Amer: >60 mL/min (ref >60)
Glucose, Bld: 89 mg/dL (ref 70–99)
Potassium: 3.6 mmol/L (ref 3.5–5.1)
Sodium: 136 mmol/L (ref 135–145)

## 2018-11-08 LAB — CBC
HCT: 41.9 % (ref 36.0–46.0)
Hemoglobin: 12.8 g/dL (ref 12.0–15.0)
MCH: 25.8 pg — ABNORMAL LOW (ref 26.0–34.0)
MCHC: 30.5 g/dL (ref 30.0–36.0)
MCV: 84.5 fL (ref 80.0–100.0)
Platelets: 227 10*3/uL (ref 150–400)
RBC: 4.96 MIL/uL (ref 3.87–5.11)
RDW: 13.5 % (ref 11.5–15.5)
WBC: 8.5 10*3/uL (ref 4.0–10.5)
nRBC: 0 % (ref 0.0–0.2)

## 2018-11-08 LAB — SURGICAL PCR SCREEN
MRSA, PCR: NEGATIVE
Staphylococcus aureus: NEGATIVE

## 2018-11-08 NOTE — Progress Notes (Signed)
  Coronavirus Screening COVID test completed at [facility] today Have you experienced the following symptoms:  Cough yes/no: No Fever (>100.47F)  yes/no: No Runny nose yes/no: No Sore throat yes/no: No Difficulty breathing/shortness of breath  yes/no: No Have you or a family member traveled in the last 14 days and where? yes/no: No  PCP - [facility] - denies  Chest x-ray -   EKG - Today  Stress Test - denies  ECHO - denies  Cardiac Cath - denies  AICD-denies PM-denies LOOP-denies  Sleep Study - denies CPAP - NA  LABS-PCR,CBC,BMP,UA,UCx  ASA-denies  ERAS-Ensure given with instructions  HA1C-denies Fasting Blood Sugar -  Checks Blood Sugar _0____ times a day  Anesthesia-No  Pt denies having chest pain, sob, or fever at this time. All instructions explained to the pt, with a verbal understanding of the material. Pt agrees to go over the instructions while at home for a better understanding. Pt also instructed to self quarantine after being tested for COVID-19. The opportunity to ask questions was provided.

## 2018-11-09 LAB — URINE CULTURE: Culture: <10000 — AB

## 2018-11-09 LAB — SARS CORONAVIRUS 2 (TAT 6-24 HRS): SARS Coronavirus 2: NEGATIVE

## 2018-11-10 MED ORDER — DEXTROSE 5 % IV SOLN
3.0000 g | INTRAVENOUS | Status: AC
  Administered 2018-11-11: 3 g via INTRAVENOUS
  Filled 2018-11-10: qty 3
  Filled 2018-11-10: qty 3000

## 2018-11-11 ENCOUNTER — Encounter (HOSPITAL_COMMUNITY)
Admission: RE | Disposition: A | Discharge status: Home Health | Payer: Self-pay | Source: Home / Self Care | Attending: Orthopedic Surgery

## 2018-11-11 ENCOUNTER — Inpatient Hospital Stay (HOSPITAL_COMMUNITY)
Admission: RE | Admit: 2018-11-11 | Discharge: 2018-11-13 | DRG: 470 | Disposition: A | Discharge status: Home Health | Payer: BC Managed Care – PPO | Attending: Orthopedic Surgery | Admitting: Orthopedic Surgery

## 2018-11-11 ENCOUNTER — Inpatient Hospital Stay (HOSPITAL_COMMUNITY): Payer: BC Managed Care – PPO | Admitting: Anesthesiology

## 2018-11-11 ENCOUNTER — Other Ambulatory Visit: Payer: Self-pay

## 2018-11-11 ENCOUNTER — Encounter (HOSPITAL_COMMUNITY): Payer: Self-pay

## 2018-11-11 DIAGNOSIS — M1711 Unilateral primary osteoarthritis, right knee: Secondary | ICD-10-CM | POA: Diagnosis not present

## 2018-11-11 DIAGNOSIS — Z791 Long term (current) use of non-steroidal anti-inflammatories (NSAID): Secondary | ICD-10-CM

## 2018-11-11 DIAGNOSIS — Z6841 Body Mass Index (BMI) 40.0 and over, adult: Secondary | ICD-10-CM

## 2018-11-11 DIAGNOSIS — M1710 Unilateral primary osteoarthritis, unspecified knee: Secondary | ICD-10-CM | POA: Diagnosis present

## 2018-11-11 DIAGNOSIS — Z882 Allergy status to sulfonamides status: Secondary | ICD-10-CM

## 2018-11-11 DIAGNOSIS — I10 Essential (primary) hypertension: Secondary | ICD-10-CM | POA: Diagnosis present

## 2018-11-11 HISTORY — DX: Unspecified osteoarthritis, unspecified site: M19.90

## 2018-11-11 HISTORY — PX: TOTAL KNEE ARTHROPLASTY: SHX125

## 2018-11-11 SURGERY — ARTHROPLASTY, KNEE, TOTAL
Anesthesia: Spinal | Site: Knee | Laterality: Right

## 2018-11-11 MED ORDER — HYDROMORPHONE HCL 1 MG/ML IJ SOLN: 0.5000 mg | INTRAMUSCULAR | Status: DC | PRN

## 2018-11-11 MED ORDER — ONDANSETRON HCL 4 MG/2ML IJ SOLN
INTRAMUSCULAR | Status: DC | PRN
  Administered 2018-11-11: 4 mg via INTRAVENOUS

## 2018-11-11 MED ORDER — PHENOL 1.4 % MT LIQD: 1.0000 | OROMUCOSAL | Status: DC | PRN

## 2018-11-11 MED ORDER — PROPOFOL 10 MG/ML IV BOLUS
INTRAVENOUS | Status: DC | PRN
  Administered 2018-11-11: 20 mg via INTRAVENOUS

## 2018-11-11 MED ORDER — ONDANSETRON HCL 4 MG PO TABS: 4.0000 mg | ORAL_TABLET | Freq: Four times a day (QID) | ORAL | Status: DC | PRN

## 2018-11-11 MED ORDER — ACETAMINOPHEN 500 MG PO TABS
ORAL_TABLET | ORAL | Status: AC
  Filled 2018-11-11: qty 2

## 2018-11-11 MED ORDER — OXYCODONE HCL 5 MG PO TABS
ORAL_TABLET | ORAL | Status: AC
  Filled 2018-11-11: qty 2

## 2018-11-11 MED ORDER — METHOCARBAMOL 500 MG PO TABS
ORAL_TABLET | ORAL | Status: AC
  Filled 2018-11-11: qty 1

## 2018-11-11 MED ORDER — BUPIVACAINE LIPOSOME 1.3 % IJ SUSP
INTRAMUSCULAR | Status: DC | PRN
  Administered 2018-11-11: 20 mL

## 2018-11-11 MED ORDER — MORPHINE SULFATE (PF) 4 MG/ML IV SOLN
INTRAVENOUS | Status: DC | PRN
  Administered 2018-11-11: 8 mg

## 2018-11-11 MED ORDER — 0.9 % SODIUM CHLORIDE (POUR BTL) OPTIME
TOPICAL | Status: DC | PRN
  Administered 2018-11-11: 1000 mL

## 2018-11-11 MED ORDER — CHLORHEXIDINE GLUCONATE 4 % EX LIQD: 60.0000 mL | Freq: Once | CUTANEOUS | Status: DC

## 2018-11-11 MED ORDER — METOCLOPRAMIDE HCL 5 MG PO TABS: 5.0000 mg | ORAL_TABLET | Freq: Three times a day (TID) | ORAL | Status: DC | PRN

## 2018-11-11 MED ORDER — CLONIDINE HCL (ANALGESIA) 100 MCG/ML EP SOLN
EPIDURAL | Status: AC
  Filled 2018-11-11: qty 10

## 2018-11-11 MED ORDER — BUPIVACAINE HCL 0.25 % IJ SOLN
INTRAMUSCULAR | Status: DC | PRN
  Administered 2018-11-11: 20 mL via INTRA_ARTICULAR
  Administered 2018-11-11: 10 mL via INTRA_ARTICULAR

## 2018-11-11 MED ORDER — DOCUSATE SODIUM 100 MG PO CAPS
100.0000 mg | ORAL_CAPSULE | Freq: Two times a day (BID) | ORAL | Status: DC
  Administered 2018-11-11 – 2018-11-13 (×4): 100 mg via ORAL
  Filled 2018-11-11 (×4): qty 1

## 2018-11-11 MED ORDER — BUPIVACAINE HCL (PF) 0.25 % IJ SOLN
INTRAMUSCULAR | Status: AC
  Filled 2018-11-11: qty 30

## 2018-11-11 MED ORDER — TRANEXAMIC ACID 1000 MG/10ML IV SOLN
INTRAVENOUS | Status: DC | PRN
  Administered 2018-11-11: 14:00:00 2000 mg via TOPICAL

## 2018-11-11 MED ORDER — SODIUM CHLORIDE 0.9% FLUSH
INTRAVENOUS | Status: DC | PRN
  Administered 2018-11-11: 20 mL

## 2018-11-11 MED ORDER — PROPOFOL 500 MG/50ML IV EMUL
INTRAVENOUS | Status: DC | PRN
  Administered 2018-11-11: 14:00:00 via INTRAVENOUS
  Administered 2018-11-11: 50 ug/kg/min via INTRAVENOUS

## 2018-11-11 MED ORDER — METOCLOPRAMIDE HCL 5 MG/ML IJ SOLN: 5.0000 mg | Freq: Three times a day (TID) | INTRAMUSCULAR | Status: DC | PRN

## 2018-11-11 MED ORDER — BUPIVACAINE LIPOSOME 1.3 % IJ SUSP
20.0000 mL | Freq: Once | INTRAMUSCULAR | Status: DC
  Filled 2018-11-11: qty 20

## 2018-11-11 MED ORDER — DEXAMETHASONE SODIUM PHOSPHATE 10 MG/ML IJ SOLN
INTRAMUSCULAR | Status: DC | PRN
  Administered 2018-11-11: 10 mg via INTRAVENOUS

## 2018-11-11 MED ORDER — GABAPENTIN 300 MG PO CAPS
300.0000 mg | ORAL_CAPSULE | Freq: Three times a day (TID) | ORAL | Status: DC
  Administered 2018-11-11 – 2018-11-13 (×7): 300 mg via ORAL
  Filled 2018-11-11 (×7): qty 1

## 2018-11-11 MED ORDER — OXYCODONE HCL 5 MG PO TABS
5.0000 mg | ORAL_TABLET | ORAL | Status: DC | PRN
  Administered 2018-11-11: 10 mg via ORAL
  Administered 2018-11-12 – 2018-11-13 (×2): 5 mg via ORAL
  Filled 2018-11-11 (×2): qty 2
  Filled 2018-11-11: qty 1
  Filled 2018-11-11: qty 2

## 2018-11-11 MED ORDER — NAPROXEN 250 MG PO TABS
250.0000 mg | ORAL_TABLET | Freq: Two times a day (BID) | ORAL | Status: DC
  Administered 2018-11-11 – 2018-11-13 (×4): 250 mg via ORAL
  Filled 2018-11-11 (×4): qty 1

## 2018-11-11 MED ORDER — MIDAZOLAM HCL 2 MG/2ML IJ SOLN
2.0000 mg | Freq: Once | INTRAMUSCULAR | Status: AC
  Administered 2018-11-11: 2 mg via INTRAVENOUS
  Filled 2018-11-11: qty 2

## 2018-11-11 MED ORDER — METHOCARBAMOL 500 MG PO TABS
500.0000 mg | ORAL_TABLET | Freq: Four times a day (QID) | ORAL | Status: DC | PRN
  Administered 2018-11-11 – 2018-11-12 (×3): 500 mg via ORAL
  Filled 2018-11-11 (×2): qty 1

## 2018-11-11 MED ORDER — METHOCARBAMOL 1000 MG/10ML IJ SOLN
500.0000 mg | Freq: Four times a day (QID) | INTRAVENOUS | Status: DC | PRN
  Filled 2018-11-11: qty 5

## 2018-11-11 MED ORDER — FENTANYL CITRATE (PF) 250 MCG/5ML IJ SOLN
INTRAMUSCULAR | Status: AC
  Filled 2018-11-11: qty 5

## 2018-11-11 MED ORDER — PROPOFOL 10 MG/ML IV BOLUS
INTRAVENOUS | Status: AC
  Filled 2018-11-11: qty 20

## 2018-11-11 MED ORDER — HYDROMORPHONE HCL 1 MG/ML IJ SOLN
INTRAMUSCULAR | Status: AC
  Filled 2018-11-11: qty 1

## 2018-11-11 MED ORDER — ASPIRIN 81 MG PO CHEW
81.0000 mg | CHEWABLE_TABLET | Freq: Two times a day (BID) | ORAL | Status: DC
  Administered 2018-11-11 – 2018-11-13 (×4): 81 mg via ORAL
  Filled 2018-11-11 (×4): qty 1

## 2018-11-11 MED ORDER — HYDROMORPHONE HCL 1 MG/ML IJ SOLN
0.2500 mg | INTRAMUSCULAR | Status: DC | PRN
  Administered 2018-11-11: 0.5 mg via INTRAVENOUS

## 2018-11-11 MED ORDER — TRANEXAMIC ACID 1000 MG/10ML IV SOLN
2000.0000 mg | Freq: Once | INTRAVENOUS | Status: DC
  Filled 2018-11-11: qty 20

## 2018-11-11 MED ORDER — PROPOFOL 1000 MG/100ML IV EMUL
INTRAVENOUS | Status: AC
  Filled 2018-11-11: qty 100

## 2018-11-11 MED ORDER — PHENYLEPHRINE HCL-NACL 10-0.9 MG/250ML-% IV SOLN
INTRAVENOUS | Status: AC
  Filled 2018-11-11: qty 250

## 2018-11-11 MED ORDER — CLONIDINE HCL (ANALGESIA) 100 MCG/ML EP SOLN
EPIDURAL | Status: DC | PRN
  Administered 2018-11-11: 1 mL via INTRA_ARTICULAR

## 2018-11-11 MED ORDER — MIDAZOLAM HCL 2 MG/2ML IJ SOLN
INTRAMUSCULAR | Status: AC
  Filled 2018-11-11: qty 2

## 2018-11-11 MED ORDER — PHENYLEPHRINE HCL (PRESSORS) 10 MG/ML IV SOLN
INTRAVENOUS | Status: DC | PRN
  Administered 2018-11-11: 80 ug via INTRAVENOUS
  Administered 2018-11-11 (×2): 40 ug via INTRAVENOUS

## 2018-11-11 MED ORDER — CEFAZOLIN SODIUM-DEXTROSE 2-4 GM/100ML-% IV SOLN
2.0000 g | Freq: Four times a day (QID) | INTRAVENOUS | Status: AC
  Administered 2018-11-11 – 2018-11-12 (×2): 2 g via INTRAVENOUS
  Filled 2018-11-11 (×2): qty 100

## 2018-11-11 MED ORDER — BUPIVACAINE IN DEXTROSE 0.75-8.25 % IT SOLN
INTRATHECAL | Status: DC | PRN
  Administered 2018-11-11: 2 mL via INTRATHECAL

## 2018-11-11 MED ORDER — MENTHOL 3 MG MT LOZG: 1.0000 | LOZENGE | OROMUCOSAL | Status: DC | PRN

## 2018-11-11 MED ORDER — FENTANYL CITRATE (PF) 250 MCG/5ML IJ SOLN
INTRAMUSCULAR | Status: DC | PRN
  Administered 2018-11-11 (×3): 50 ug via INTRAVENOUS

## 2018-11-11 MED ORDER — ONDANSETRON HCL 4 MG/2ML IJ SOLN: 4.0000 mg | Freq: Four times a day (QID) | INTRAMUSCULAR | Status: DC | PRN

## 2018-11-11 MED ORDER — MORPHINE SULFATE (PF) 4 MG/ML IV SOLN
INTRAVENOUS | Status: AC
  Filled 2018-11-11: qty 2

## 2018-11-11 MED ORDER — LACTATED RINGERS IV SOLN
INTRAVENOUS | Status: DC | PRN
  Administered 2018-11-11 (×2): via INTRAVENOUS

## 2018-11-11 MED ORDER — POVIDONE-IODINE 10 % EX SWAB: 2.0000 "application " | Freq: Once | CUTANEOUS | Status: DC

## 2018-11-11 MED ORDER — MIDAZOLAM HCL 2 MG/2ML IJ SOLN
INTRAMUSCULAR | Status: DC | PRN
  Administered 2018-11-11: 2 mg via INTRAVENOUS

## 2018-11-11 MED ORDER — TRANEXAMIC ACID-NACL 1000-0.7 MG/100ML-% IV SOLN
1000.0000 mg | INTRAVENOUS | Status: AC
  Administered 2018-11-11: 12:00:00 1000 mg via INTRAVENOUS
  Filled 2018-11-11: qty 100

## 2018-11-11 MED ORDER — BUPIVACAINE-EPINEPHRINE (PF) 0.5% -1:200000 IJ SOLN
INTRAMUSCULAR | Status: DC | PRN
  Administered 2018-11-11: 20 mL via PERINEURAL

## 2018-11-11 MED ORDER — FENTANYL CITRATE (PF) 100 MCG/2ML IJ SOLN
100.0000 ug | Freq: Once | INTRAMUSCULAR | Status: AC
  Administered 2018-11-11: 100 ug via INTRAVENOUS
  Filled 2018-11-11: qty 2

## 2018-11-11 MED ORDER — ACETAMINOPHEN 325 MG PO TABS
325.0000 mg | ORAL_TABLET | Freq: Four times a day (QID) | ORAL | Status: DC | PRN
  Administered 2018-11-12: 650 mg via ORAL
  Filled 2018-11-11: qty 2

## 2018-11-11 MED ORDER — ACETAMINOPHEN 500 MG PO TABS
1000.0000 mg | ORAL_TABLET | Freq: Once | ORAL | Status: AC
  Administered 2018-11-11: 1000 mg via ORAL

## 2018-11-11 MED ORDER — GLYCOPYRROLATE 0.2 MG/ML IJ SOLN
INTRAMUSCULAR | Status: DC | PRN
  Administered 2018-11-11: 0.2 mg via INTRAVENOUS

## 2018-11-11 SURGICAL SUPPLY — 88 items
BAG DECANTER FOR FLEXI CONT (MISCELLANEOUS) ×5 IMPLANT
BANDAGE ESMARK 6X9 LF (GAUZE/BANDAGES/DRESSINGS) ×1 IMPLANT
BLADE SAG 18X100X1.27 (BLADE) ×3 IMPLANT
BLADE SAW SGTL 13.0X1.19X90.0M (BLADE) ×2 IMPLANT
BLADE SURG 10 STRL SS (BLADE) ×4 IMPLANT
BNDG CMPR 9X6 STRL LF SNTH (GAUZE/BANDAGES/DRESSINGS) ×1
BNDG CMPR MED 15X6 ELC VLCR LF (GAUZE/BANDAGES/DRESSINGS) ×1
BNDG COHESIVE 6X5 TAN STRL LF (GAUZE/BANDAGES/DRESSINGS) ×3 IMPLANT
BNDG ELASTIC 6X15 VLCR STRL LF (GAUZE/BANDAGES/DRESSINGS) ×3 IMPLANT
BNDG ESMARK 6X9 LF (GAUZE/BANDAGES/DRESSINGS) ×3
BOWL SMART MIX CTS (DISPOSABLE) IMPLANT
BSPLAT TIB 4 KN TRITANIUM (Knees) ×1 IMPLANT
CLOSURE WOUND 1/2 X4 (GAUZE/BANDAGES/DRESSINGS) ×2
CONT SPECI 4OZ STER CLIK (MISCELLANEOUS) ×3 IMPLANT
COVER SURGICAL LIGHT HANDLE (MISCELLANEOUS) ×3 IMPLANT
COVER WAND RF STERILE (DRAPES) ×1 IMPLANT
CUFF TOURN SGL QUICK 42 (TOURNIQUET CUFF) ×2 IMPLANT
DECANTER SPIKE VIAL GLASS SM (MISCELLANEOUS) ×5 IMPLANT
DRAPE INCISE IOBAN 66X45 STRL (DRAPES) ×2 IMPLANT
DRAPE ORTHO SPLIT 77X108 STRL (DRAPES) ×9
DRAPE SURG ORHT 6 SPLT 77X108 (DRAPES) ×3 IMPLANT
DRAPE U-SHAPE 47X51 STRL (DRAPES) ×3 IMPLANT
DRSG AQUACEL AG ADV 3.5X14 (GAUZE/BANDAGES/DRESSINGS) ×2 IMPLANT
DURAPREP 26ML APPLICATOR (WOUND CARE) ×6 IMPLANT
ELECT CAUTERY BLADE 6.4 (BLADE) ×3 IMPLANT
ELECT REM PT RETURN 9FT ADLT (ELECTROSURGICAL) ×3
ELECTRODE REM PT RTRN 9FT ADLT (ELECTROSURGICAL) ×1 IMPLANT
GAUZE SPONGE 4X4 12PLY STRL (GAUZE/BANDAGES/DRESSINGS) ×1 IMPLANT
GLOVE BIO SURGEON STRL SZ7 (GLOVE) ×2 IMPLANT
GLOVE BIOGEL PI IND STRL 7.0 (GLOVE) IMPLANT
GLOVE BIOGEL PI IND STRL 7.5 (GLOVE) ×1 IMPLANT
GLOVE BIOGEL PI IND STRL 8 (GLOVE) ×1 IMPLANT
GLOVE BIOGEL PI INDICATOR 7.0 (GLOVE) ×2
GLOVE BIOGEL PI INDICATOR 7.5 (GLOVE) ×4
GLOVE BIOGEL PI INDICATOR 8 (GLOVE) ×2
GLOVE ECLIPSE 7.0 STRL STRAW (GLOVE) ×3 IMPLANT
GLOVE SURG ORTHO 8.0 STRL STRW (GLOVE) ×3 IMPLANT
GLOVE SURG SS PI 7.5 STRL IVOR (GLOVE) ×2 IMPLANT
GOWN STRL REUS W/ TWL LRG LVL3 (GOWN DISPOSABLE) ×3 IMPLANT
GOWN STRL REUS W/ TWL XL LVL3 (GOWN DISPOSABLE) IMPLANT
GOWN STRL REUS W/TWL LRG LVL3 (GOWN DISPOSABLE) ×12
GOWN STRL REUS W/TWL XL LVL3 (GOWN DISPOSABLE) ×3
HANDPIECE INTERPULSE COAX TIP (DISPOSABLE) ×3
HOOD PEEL AWAY FACE SHEILD DIS (HOOD) ×6 IMPLANT
HOOD PEEL AWAY FLYTE STAYCOOL (MISCELLANEOUS) ×7 IMPLANT
IMMOBILIZER KNEE 20 (SOFTGOODS) IMPLANT
IMMOBILIZER KNEE 22 (SOFTGOODS) ×2 IMPLANT
IMMOBILIZER KNEE 22 UNIV (SOFTGOODS) IMPLANT
IMMOBILIZER KNEE 24 THIGH 36 (MISCELLANEOUS) IMPLANT
IMMOBILIZER KNEE 24 UNIV (MISCELLANEOUS)
INSERT TRIATH CS SZ4 14 (Insert) ×2 IMPLANT
IV NS IRRIG 3000ML ARTHROMATIC (IV SOLUTION) ×4 IMPLANT
JET LAVAGE IRRISEPT WOUND (IRRIGATION / IRRIGATOR) ×3
KIT BASIN OR (CUSTOM PROCEDURE TRAY) ×3 IMPLANT
KIT TURNOVER KIT B (KITS) ×3 IMPLANT
KNEE FEMORAL COMP RT RETAIN (Knees) ×2 IMPLANT
KNEE PATELLA ASYMMETRIC 10X35 (Knees) ×2 IMPLANT
KNEE TIBIAL COMP TRI SZ4 (Knees) ×2 IMPLANT
LAVAGE JET IRRISEPT WOUND (IRRIGATION / IRRIGATOR) IMPLANT
MANIFOLD NEPTUNE II (INSTRUMENTS) ×3 IMPLANT
NDL 18GX1X1/2 (RX/OR ONLY) (NEEDLE) IMPLANT
NDL SPNL 18GX3.5 QUINCKE PK (NEEDLE) ×1 IMPLANT
NEEDLE 18GX1X1/2 (RX/OR ONLY) (NEEDLE) ×3 IMPLANT
NEEDLE 22X1 1/2 (OR ONLY) (NEEDLE) ×6 IMPLANT
NEEDLE SPNL 18GX3.5 QUINCKE PK (NEEDLE) ×3 IMPLANT
NS IRRIG 1000ML POUR BTL (IV SOLUTION) ×6 IMPLANT
PACK TOTAL JOINT (CUSTOM PROCEDURE TRAY) ×3 IMPLANT
PAD ARMBOARD 7.5X6 YLW CONV (MISCELLANEOUS) ×6 IMPLANT
PAD CAST 4YDX4 CTTN HI CHSV (CAST SUPPLIES) ×1 IMPLANT
PADDING CAST COTTON 4X4 STRL (CAST SUPPLIES) ×3
PADDING CAST COTTON 6X4 STRL (CAST SUPPLIES) ×3 IMPLANT
SET HNDPC FAN SPRY TIP SCT (DISPOSABLE) ×1 IMPLANT
STRIP CLOSURE SKIN 1/2X4 (GAUZE/BANDAGES/DRESSINGS) ×4 IMPLANT
SUCTION FRAZIER HANDLE 10FR (MISCELLANEOUS) ×2
SUCTION TUBE FRAZIER 10FR DISP (MISCELLANEOUS) ×1 IMPLANT
SUT MNCRL AB 3-0 PS2 18 (SUTURE) ×3 IMPLANT
SUT VIC AB 0 CT1 27 (SUTURE) ×9
SUT VIC AB 0 CT1 27XBRD ANBCTR (SUTURE) ×3 IMPLANT
SUT VIC AB 1 CT1 27 (SUTURE) ×18
SUT VIC AB 1 CT1 27XBRD ANBCTR (SUTURE) ×5 IMPLANT
SUT VIC AB 2-0 CT1 27 (SUTURE) ×15
SUT VIC AB 2-0 CT1 TAPERPNT 27 (SUTURE) ×4 IMPLANT
SYR 30ML LL (SYRINGE) ×9 IMPLANT
SYR TB 1ML LUER SLIP (SYRINGE) ×3 IMPLANT
TOWEL GREEN STERILE (TOWEL DISPOSABLE) ×6 IMPLANT
TOWEL GREEN STERILE FF (TOWEL DISPOSABLE) ×4 IMPLANT
TRAY CATH 16FR W/PLASTIC CATH (SET/KITS/TRAYS/PACK) IMPLANT
WATER STERILE IRR 1000ML POUR (IV SOLUTION) IMPLANT

## 2018-11-11 NOTE — Brief Op Note (Signed)
   11/11/2018  3:57 PM  PATIENT:  Sandra Shea  53 y.o. female  PRE-OPERATIVE DIAGNOSIS:  right knee osteoarthritis  POST-OPERATIVE DIAGNOSIS:  right knee osteoarthritis  PROCEDURE:  Procedure(s): RIGHT TOTAL KNEE ARTHROPLASTY  SURGEON:  Surgeon(s): Meredith Pel, MD  ASSISTANT: Magnant PA  ANESTHESIA:   spinal  EBL: 100 ml    Total I/O In: 2000 [I.V.:2000] Out: 1000 [Urine:1000]  BLOOD ADMINISTERED: none  DRAINS: none   LOCAL MEDICATIONS USED: Marcaine morphine clonidine Exparel  SPECIMEN:  No Specimen  COUNTS:  YES  TOURNIQUET:   Total Tourniquet Time Documented: Thigh (Right) - 85 minutes Total: Thigh (Right) - 85 minutes   DICTATION: .Other Dictation: Dictation Number [id number]  PLAN OF CARE: Admit for overnight observation  PATIENT DISPOSITION:  PACU - hemodynamically stable

## 2018-11-11 NOTE — H&P (Signed)
TOTAL KNEE ADMISSION H&P  Patient is being admitted for right total knee arthroplasty.  Subjective:  Chief Complaint:right knee pain.  HPI: Sandra Shea, 53 y.o. female, has a history of pain and functional disability in the right knee due to arthritis and has failed non-surgical conservative treatments for greater than 12 weeks to includeNSAID's and/or analgesics, corticosteriod injections, viscosupplementation injections, flexibility and strengthening excercises and activity modification.  Onset of symptoms was gradual, starting 7 years ago with gradually worsening course since that time. The patient noted no past surgery on the right knee(s).  Patient currently rates pain in the right knee(s) at 10 out of 10 with activity. Patient has night pain, worsening of pain with activity and weight bearing, pain that interferes with activities of daily living, pain with passive range of motion, crepitus and joint swelling.  Patient has evidence of subchondral cysts, subchondral sclerosis, periarticular osteophytes, joint subluxation and joint space narrowing by imaging studies. This patient has had Worsening history of right knee pain.  Currently it is interfering with her work as well as ADLs.  Even though she does have increased body mass index and understands the increased risk associated with that she would like to proceed with knee replacement.. There is no active infection.  Patient Active Problem List   Diagnosis Date Noted  . Osteoarthritis of left knee 10/18/2014  . Unspecified essential hypertension 09/24/2012  . Obesity, unspecified 09/24/2012   Past Medical History:  Diagnosis Date  . Hypertension     Past Surgical History:  Procedure Laterality Date  . KNEE SURGERY      Current Facility-Administered Medications  Medication Dose Route Frequency Provider Last Rate Last Dose  . acetaminophen (TYLENOL) tablet 1,000 mg  1,000 mg Oral Once Roderic Palau, MD      . ceFAZolin (ANCEF) 3  g in dextrose 5 % 50 mL IVPB  3 g Intravenous On Call to OR Meredith Pel, MD      . chlorhexidine (HIBICLENS) 4 % liquid 4 application  60 mL Topical Once Magnant, Charles L, PA-C      . chlorhexidine (HIBICLENS) 4 % liquid 4 application  60 mL Topical Once Magnant, Gerrianne Scale, PA-C      . fentaNYL (SUBLIMAZE) injection 100 mcg  100 mcg Intravenous Once Roderic Palau, MD      . midazolam (VERSED) injection 2 mg  2 mg Intravenous Once Roderic Palau, MD      . povidone-iodine 10 % swab 2 application  2 application Topical Once Magnant, Charles L, PA-C      . tranexamic acid (CYKLOKAPRON) IVPB 1,000 mg  1,000 mg Intravenous To OR Magnant, Charles L, PA-C       Allergies  Allergen Reactions  . Sulfa Antibiotics Hives    Social History   Tobacco Use  . Smoking status: Never Smoker  . Smokeless tobacco: Never Used  Substance Use Topics  . Alcohol use: Yes    Comment: socially    History reviewed. No pertinent family history.   ROS  Objective:  Physical Exam  Vital signs in last 24 hours: Temp:  [98.3 F (36.8 C)] 98.3 F (36.8 C) (07/16 1029) Pulse Rate:  [87] 87 (07/16 1029) Resp:  [18] 18 (07/16 1029) BP: (154)/(90) 154/90 (07/16 1029) SpO2:  [100 %] 100 % (07/16 1029) Weight:  [126.7 kg] 126.7 kg (07/16 1029)  Labs:   Estimated body mass index is 45.08 kg/m as calculated from the following:   Height as of this  encounter: 5\' 6"  (1.676 m).   Weight as of this encounter: 126.7 kg.   Imaging Review Plain radiographs demonstrate severe degenerative joint disease of the right knee(s). The overall alignment issignificant varus. The bone quality appears to be good for age and reported activity level.      Assessment/Plan:  End stage arthritis, right knee   The patient history, physical examination, clinical judgment of the provider and imaging studies are consistent with end stage degenerative joint disease of the right knee(s) and total knee  arthroplasty is deemed medically necessary. The treatment options including medical management, injection therapy arthroscopy and arthroplasty were discussed at length. The risks and benefits of total knee arthroplasty were presented and reviewed. The risks due to aseptic loosening, infection, stiffness, patella tracking problems, thromboembolic complications and other imponderables were discussed. The patient acknowledged the explanation, agreed to proceed with the plan and consent was signed. Patient is being admitted for inpatient treatment for surgery, pain control, PT, OT, prophylactic antibiotics, VTE prophylaxis, progressive ambulation and ADL's and discharge planning. The patient is planning to be discharged home with home health services     Patient's anticipated LOS is less than 2 midnights, meeting these requirements: - Younger than 32 - Lives within 1 hour of care - Has a competent adult at home to recover with post-op recover - NO history of  - Chronic pain requiring opiods  - Diabetes  - Coronary Artery Disease  - Heart failure  - Heart attack  - Stroke  - DVT/VTE  - Cardiac arrhythmia  - Respiratory Failure/COPD  - Renal failure  - Anemia  - Advanced Liver disease

## 2018-11-11 NOTE — Evaluation (Signed)
Physical Therapy Evaluation Patient Details Name: Sandra Shea MRN: [id number] DOB: 30-Oct-1965 Today's Date: 11/11/2018   History of Present Illness  Pt is a 53 y/o female s/p R TKA. PMH includes HTN.   Clinical Impression  Pt is s/p surgery above with deficits below. Pt limited this session secondary to pain, dizziness, and nerve block was still active. Required mod A to perform bed mobility tasks this session. Did exhibit AROM in BLE, however, pt with decreased sensation and was unable to stand. Educated about knee precautions and supine/seated HEP. Will continue to follow acutely to maximize functional mobility independence and safety.     Follow Up Recommendations Follow surgeon's recommendation for DC plan and follow-up therapies;Supervision for mobility/OOB    Equipment Recommendations  None recommended by PT    Recommendations for Other Services       Precautions / Restrictions Precautions Precautions: Knee Precaution Booklet Issued: No Precaution Comments: Reviewed knee precautions with pt.  Restrictions Weight Bearing Restrictions: Yes RLE Weight Bearing: Weight bearing as tolerated      Mobility  Bed Mobility Overal bed mobility: Needs Assistance Bed Mobility: Supine to Sit;Sit to Supine     Supine to sit: Mod assist Sit to supine: Mod assist   General bed mobility comments: Mod A for BLE assist to come to sitting and return to supine. Pt unable to attempt standing, as nerve block still in place. Pt also reporting some dizziness in sitting.   Transfers                 General transfer comment: Unable   Ambulation/Gait                Stairs            Wheelchair Mobility    Modified Rankin (Stroke Patients Only)       Balance Overall balance assessment: Needs assistance Sitting-balance support: No upper extremity supported;Feet supported Sitting balance-Leahy Scale: Good                                        Pertinent Vitals/Pain Pain Assessment: 0-10 Pain Score: 5  Pain Location: R knee (lateral part) Pain Descriptors / Indicators: Aching;Operative site guarding Pain Intervention(s): Limited activity within patient's tolerance;Monitored during session;Repositioned    Home Living Family/patient expects to be discharged to:: Private residence Living Arrangements: Parent;Children Available Help at Discharge: Family;Available 24 hours/day Type of Home: House Home Access: Level entry     Home Layout: One level Home Equipment: Walker - 2 wheels;Bedside commode      Prior Function Level of Independence: Independent               Hand Dominance        Extremity/Trunk Assessment   Upper Extremity Assessment Upper Extremity Assessment: Overall WFL for tasks assessed    Lower Extremity Assessment Lower Extremity Assessment: RLE deficits/detail;LLE deficits/detail RLE Deficits / Details: Deficits consistent with post op pain and weakness. Reporting numbness throughout RLE. Was able to perform ankle pump, quad set and LAQ.  LLE Deficits / Details: Reports numbness throughout LLE, however, could move LLE throughout full ROM.        Communication   Communication: No difficulties  Cognition Arousal/Alertness: Awake/alert Behavior During Therapy: WFL for tasks assessed/performed Overall Cognitive Status: Within Functional Limits for tasks assessed  General Comments      Exercises Total Joint Exercises Ankle Circles/Pumps: AROM;Both;10 reps;Supine Quad Sets: AROM;Right;5 reps;Supine Long Arc Quad: AROM;Right;5 reps;Seated   Assessment/Plan    PT Assessment Patient needs continued PT services  PT Problem List Decreased strength;Decreased balance;Decreased mobility;Decreased range of motion;Decreased knowledge of use of DME;Decreased knowledge of precautions;Decreased coordination;Pain       PT Treatment  Interventions DME instruction;Gait training;Functional mobility training;Therapeutic activities;Balance training;Therapeutic exercise;Patient/family education    PT Goals (Current goals can be found in the Care Plan section)  Acute Rehab PT Goals Patient Stated Goal: to decrease pain  PT Goal Formulation: With patient Time For Goal Achievement: 11/25/18 Potential to Achieve Goals: Good    Frequency 7X/week   Barriers to discharge        Co-evaluation               AM-PAC PT "6 Clicks" Mobility  Outcome Measure Help needed turning from your back to your side while in a flat bed without using bedrails?: A Lot Help needed moving from lying on your back to sitting on the side of a flat bed without using bedrails?: A Lot Help needed moving to and from a bed to a chair (including a wheelchair)?: A Lot Help needed standing up from a chair using your arms (e.g., wheelchair or bedside chair)?: A Lot Help needed to walk in hospital room?: Total Help needed climbing 3-5 steps with a railing? : Total 6 Click Score: 10    End of Session   Activity Tolerance: Treatment limited secondary to medical complications (Comment)(nerve block still in place) Patient left: in bed;with call bell/phone within reach;with nursing/sitter in room Nurse Communication: Mobility status;Patient requests pain meds PT Visit Diagnosis: Muscle weakness (generalized) (M62.81);Unsteadiness on feet (R26.81);Difficulty in walking, not elsewhere classified (R26.2)    Time: 9292-4462 PT Time Calculation (min) (ACUTE ONLY): 28 min   Charges:   PT Evaluation $PT Eval Low Complexity: 1 Low PT Treatments $Therapeutic Activity: 8-22 mins        Leighton Ruff, PT, DPT  Acute Rehabilitation Services  Pager: [phone] Office: [phone]   Rudean Hitt 11/11/2018, 6:36 PM

## 2018-11-11 NOTE — Transfer of Care (Addendum)
Immediate Anesthesia Transfer of Care Note  Patient: Sandra Shea  Procedure(s) Performed: RIGHT TOTAL KNEE ARTHROPLASTY (Right Knee)  Patient Location: PACU  Anesthesia Type:Regional and Spinal  Level of Consciousness: drowsy and patient cooperative  Airway & Oxygen Therapy: Patient Spontanous Breathing and Patient connected to nasal cannula oxygen  Post-op Assessment: Report given to RN, Post -op Vital signs reviewed and stable and Patient moving all extremities X 4  Post vital signs: Reviewed and stable  Last Vitals:  Vitals Value Taken Time  BP 134/92 11/11/18 1552  Temp    Pulse 85 11/11/18 1554  Resp 13 11/11/18 1554  SpO2 98 % 11/11/18 1554  Vitals shown include unvalidated device data.  Last Pain:  Vitals:   11/11/18 1029  TempSrc: Oral  PainSc: 0-No pain      Patients Stated Pain Goal: 2 (88/28/00 3491)  Complications: No apparent anesthesia complications

## 2018-11-11 NOTE — Progress Notes (Signed)
Orthopedic Tech Progress Note Patient Details:  Sandra Shea 02/13/1966 [id number]  CPM Right Knee CPM Right Knee: On Right Knee Flexion (Degrees): 40 Right Knee Extension (Degrees): 10 Additional Comments: foot roll  Post Interventions Patient Tolerated: Well Instructions Provided: Care of device  [name] 11/11/2018, 4:28 PM

## 2018-11-11 NOTE — Anesthesia Procedure Notes (Signed)
Spinal  Patient location during procedure: OR Start time: 11/11/2018 12:14 PM End time: 11/11/2018 12:18 PM Staffing Anesthesiologist: Roderic Palau, MD Performed: anesthesiologist  Preanesthetic Checklist Completed: patient identified, surgical consent, pre-op evaluation, timeout performed, IV checked, risks and benefits discussed and monitors and equipment checked Spinal Block Patient position: sitting Prep: DuraPrep Patient monitoring: cardiac monitor, continuous pulse ox and blood pressure Approach: midline Location: L3-4 Injection technique: single-shot Needle Needle type: Pencan  Needle gauge: 24 G Needle length: 9 cm Assessment Sensory level: T8 Additional Notes Functioning IV was confirmed and monitors were applied. Sterile prep and drape, including hand hygiene and sterile gloves were used. The patient was positioned and the spine was prepped. The skin was anesthetized with lidocaine.  Free flow of clear CSF was obtained prior to injecting local anesthetic into the CSF.  The spinal needle aspirated freely following injection.  The needle was carefully withdrawn.  The patient tolerated the procedure well.

## 2018-11-11 NOTE — Anesthesia Procedure Notes (Signed)
Anesthesia Regional Block: Adductor canal block   Pre-Anesthetic Checklist: ,, timeout performed, Correct Patient, Correct Site, Correct Laterality, Correct Procedure, Correct Position, site marked, Risks and benefits discussed, pre-op evaluation,  At surgeon's request and post-op pain management  Laterality: Right  Prep: Maximum Sterile Barrier Precautions used, chloraprep       Needles:  Injection technique: Single-shot  Needle Type: Echogenic Stimulator Needle     Needle Length: 9cm  Needle Gauge: 21     Additional Needles:   Procedures:,,,, ultrasound used (permanent image in chart),,,,  Narrative:  Start time: 11/11/2018 10:52 AM End time: 11/11/2018 11:02 AM Injection made incrementally with aspirations every 5 mL.  Performed by: Personally  Anesthesiologist: Roderic Palau, MD  Additional Notes: 2% Lidocaine skin wheel.

## 2018-11-11 NOTE — Op Note (Signed)
NAMEPENNIE, Sandra Shea MEDICAL RECORD [id number] ACCOUNT [id number] DATE OF BIRTH:1965-10-26 FACILITY: [facility] LOCATION: [facility] PHYSICIAN:Oneika Simonian Randel Pigg, MD  OPERATIVE REPORT  DATE OF PROCEDURE:  11/11/2018  PREOPERATIVE DIAGNOSIS:  Right knee arthritis.  POSTOPERATIVE DIAGNOSIS:  Right knee arthritis.  PROCEDURE:  Right total knee replacement.  SURGEON:  Meredith Pel, MD  ASSISTANT:  Magnet  INDICATIONS:  Sandra Shea is a patient with end-stage right knee arthritis who presents for operative management after explanation of risks and benefits.  IMPLANTS UTILIZED:  Include Stryker press-fit, posterior cruciate-retaining size 4 femur, 4 tibia, 14 poly, deep dish insert with 35 mm 3-peg press-fit patella.  PROCEDURE IN DETAIL:  The patient was brought to the operating room where spinal anesthetic was induced.  Preoperative antibiotics were administered.  Time-out was called.  Right leg prescrubbed with alcohol and Betadine, allowed to air dry, prepped with  DuraPrep solution, and draped in sterile manner.  Charlie Pitter was used to cover the operative field.  The leg was then elevated, exsanguinated with the Esmarch wrap.  Tourniquet was inflated.  After time-out was called, the anterior approach to knee was made.   Skin and subcutaneous tissue were sharply divided.  Median parapatellar approach was made and marked with a #1 Vicryl suture.  The patient had very large osteophytes present on both the femur and the tibia.  These were removed with a rongeur.   Significant tricompartmental arthritis was present, but it was worse on the medial side.  In accordance with preoperative templating, a cut on the tibia was made perpendicular to the mechanical axis, measuring 9 mm off of the least-affected lateral  tibial plateau.  This was made with the posterior neurovascular structures and collaterals protected.  Once this cut was made, attention was then directed towards the femur.  The patient did have a  preoperative flexion contracture of about 5-8 degrees.   Initially, a 10 mm cut was planned, but a 12 mm cut was made.  The posterior, anterior, and chamfer cuts were made.  At this time, trial component was stable with a 13 and 15 spacer.  The spacer was removed.  The tibia was keel punched.  Bone quality was  good.  At this time, the tibia was cut down from a 22 mm to 12.  A 35 mm 3-peg patella was placed.  The patient had very good stability to varus and valgus stress at 0 and 30 degrees with the 13 and 15 spacer in place.  Patella tracked very well with  no-thumbs technique.  Trial components were removed.  Thorough irrigation was performed.  IrriSept irrigation was also utilized during the case.  At this time, the components were tapped into position.  There was a spacer which was not available, which  had to be retrieved at [facility], which did add to the duration of the case.  Tourniquet was not inflated during this time.  With the 14 spacer in place in position, good stability and range of motion were maintained.  Good patellar tracking  also was present.  Thorough irrigation was then again performed.  Exparel and Marcaine were used to anesthetize the capsule.  At this time, the knee was closed over a bolster using #1 Vicryl suture followed by interrupted inverted 0 Vicryl suture, 2-0  Vicryl suture, and a 3-0 Monocryl.  A solution of Marcaine, morphine, and clonidine injected into the knee itself for postop pain relief.  Steri-Strips and an Aquacel dressing applied along with a knee immobilizer.  Luke's assistance was required at all  times during the case for retraction.  His assistance was a medical necessity.  LN/NUANCE  D:11/11/2018 T:11/11/2018 JOB:[id number]

## 2018-11-11 NOTE — Anesthesia Preprocedure Evaluation (Addendum)
Anesthesia Evaluation  Patient identified by MRN, date of birth, ID band Patient awake    Reviewed: Allergy & Precautions, H&P , NPO status , Patient's Chart, lab work & pertinent test results  Airway Mallampati: III  TM Distance: >3 FB Neck ROM: Full    Dental no notable dental hx. (+) Teeth Intact, Dental Advisory Given   Pulmonary neg pulmonary ROS,    Pulmonary exam normal breath sounds clear to auscultation       Cardiovascular hypertension, Pt. on medications  Rhythm:Regular Rate:Normal     Neuro/Psych negative neurological ROS  negative psych ROS   GI/Hepatic negative GI ROS, Neg liver ROS,   Endo/Other  Morbid obesity  Renal/GU negative Renal ROS  negative genitourinary   Musculoskeletal  (+) Arthritis , Osteoarthritis,    Abdominal   Peds  Hematology negative hematology ROS (+)   Anesthesia Other Findings   Reproductive/Obstetrics negative OB ROS                            Anesthesia Physical Anesthesia Plan  ASA: III  Anesthesia Plan: Spinal   Post-op Pain Management:  Regional for Post-op pain   Induction: Intravenous  PONV Risk Score and Plan: 3 and Ondansetron, Propofol infusion and Midazolam  Airway Management Planned: Simple Face Mask  Additional Equipment:   Intra-op Plan:   Post-operative Plan:   Informed Consent: I have reviewed the patients History and Physical, chart, labs and discussed the procedure including the risks, benefits and alternatives for the proposed anesthesia with the patient or authorized representative who has indicated his/her understanding and acceptance.     Dental advisory given  Plan Discussed with: CRNA  Anesthesia Plan Comments:         Anesthesia Quick Evaluation

## 2018-11-12 DIAGNOSIS — Z791 Long term (current) use of non-steroidal anti-inflammatories (NSAID): Secondary | ICD-10-CM | POA: Diagnosis not present

## 2018-11-12 DIAGNOSIS — Z6841 Body Mass Index (BMI) 40.0 and over, adult: Secondary | ICD-10-CM | POA: Diagnosis not present

## 2018-11-12 DIAGNOSIS — I10 Essential (primary) hypertension: Secondary | ICD-10-CM | POA: Diagnosis present

## 2018-11-12 DIAGNOSIS — M1711 Unilateral primary osteoarthritis, right knee: Secondary | ICD-10-CM | POA: Diagnosis present

## 2018-11-12 DIAGNOSIS — Z882 Allergy status to sulfonamides status: Secondary | ICD-10-CM | POA: Diagnosis not present

## 2018-11-12 NOTE — Social Work (Signed)
CSW acknowledging consult for SNF placement. Will follow for further therapy recommendations today.   Westley Hummer, MSW, [place] Work [phone]

## 2018-11-12 NOTE — Progress Notes (Signed)
Orthopedic Tech Progress Note Patient Details:  Sandra Shea Sep 22, 1965 [id number]  CPM Right Knee CPM Right Knee: On Right Knee Flexion (Degrees): 40 Right Knee Extension (Degrees): 10 Additional Comments: foot roll  Post Interventions Patient Tolerated: Well Instructions Provided: Care of device  [name], [name] T 11/12/2018, 6:27 AM

## 2018-11-12 NOTE — TOC Initial Note (Signed)
Transition of Care [facility]) - Initial/Assessment Note    Patient Details  Name: Sandra Shea MRN: [id number] Date of Birth: [birth year]  [facility]) CM/SW Contact:    Marilu Favre, RN Phone Number: 11/12/2018, 10:00 AM  Clinical Narrative:                 Confirmed face sheet information.   Walker and 3 in 1 was delivered to her home prior to surgery.  Expected Discharge Plan: [place] Barriers to Discharge: Continued Medical Work up   Patient Goals and CMS Choice Patient states their goals for this hospitalization and ongoing recovery are:: to go home CMS Medicare.gov Compare Post Acute Care list provided to:: Patient Choice offered to / list presented to : Patient  Expected Discharge Plan and Services Expected Discharge Plan: [place]   Discharge Planning Services: CM Consult Post Acute Care Choice: [place] arrangements for the past 2 months: Single Family Home                 DME Arranged: N/A         HH Arranged: PT HH Agency: [place] [facility] (now [facility]) Date [place]: 11/12/18 Time [place]: [phone] Representative spoke with at [place]: Joen Laura  Prior Living Arrangements/Services Living arrangements for the past 2 months: [place] with:: Relatives Patient language and need for interpreter reviewed:: Yes Do you feel safe going back to the place where you live?: Yes      Need for Family Participation in Patient Care: Yes (Comment) Care giver support system in place?: Yes (comment)   Criminal Activity/Legal Involvement Pertinent to Current Situation/Hospitalization: No - Comment as needed  Activities of Daily Living Home Assistive Devices/Equipment: Cane (specify quad or straight), Walker (specify type), Crutches ADL Screening (condition at time of admission) Patient's cognitive ability adequate to safely complete daily activities?: Yes Is  the patient deaf or have difficulty hearing?: No Does the patient have difficulty seeing, even when wearing glasses/contacts?: No Does the patient have difficulty concentrating, remembering, or making decisions?: No Patient able to express need for assistance with ADLs?: Yes Does the patient have difficulty dressing or bathing?: No Independently performs ADLs?: Yes (appropriate for developmental age) Does the patient have difficulty walking or climbing stairs?: Yes Weakness of Legs: Right Weakness of Arms/Hands: None  Permission Sought/Granted   Permission granted to share information with : Yes, Verbal Permission Granted     Permission granted to share info w AGENCY: [facility]        Emotional Assessment Appearance:: Appears stated age Attitude/Demeanor/Rapport: Engaged Affect (typically observed): Accepting Orientation: : Oriented to Self, Oriented to Place, Oriented to  Time, Oriented to Situation Alcohol / Substance Use: Not Applicable Psych Involvement: No (comment)  Admission diagnosis:  right knee osteoarthritis Patient Active Problem List   Diagnosis Date Noted  . Arthritis of knee 11/11/2018  . Osteoarthritis of left knee 10/18/2014  . Unspecified essential hypertension 09/24/2012  . Obesity, unspecified 09/24/2012   PCP:  Kennyth Arnold, FNP Pharmacy:   [employer], [place] - [address] [place] [address] Phone: [phone] Fax: [phone]  [place], [place] [address] Phone: [phone] Fax: [phone]     Social Determinants of Health (SDOH) Interventions  Readmission Risk Interventions No flowsheet data found.

## 2018-11-12 NOTE — Progress Notes (Signed)
Physical Therapy Treatment Patient Details Name: Sandra Shea MRN: [id number] DOB: 26-May-1965 Today's Date: 11/12/2018    History of Present Illness Pt is a 53 y/o female s/p R TKA. PMH includes HTN.     PT Comments    Pt performed gt training and functional mobility.  She is progressing but remains limited due to pain and dizziness.  She required moderate assistance to achieve standing and min assistance to ambulate 8 ft with close chair follow.  Plan for follow up session this pm to progress mobility.     Follow Up Recommendations  Follow surgeon's recommendation for DC plan and follow-up therapies;Supervision for mobility/OOB     Equipment Recommendations  None recommended by PT    Recommendations for Other Services       Precautions / Restrictions Precautions Precautions: Knee Precaution Booklet Issued: No Restrictions Weight Bearing Restrictions: Yes RLE Weight Bearing: Weight bearing as tolerated    Mobility  Bed Mobility Overal bed mobility: Needs Assistance Bed Mobility: Supine to Sit     Supine to sit: Min assist     General bed mobility comments: PTA assisted RLE to edge of bed.  Pt with heavy use of rails to elevate trunk into sitting and scoot to edge of bed.  Transfers Overall transfer level: Needs assistance Equipment used: Rolling walker (2 wheeled) Transfers: Sit to/from Stand Sit to Stand: Mod assist;+2 safety/equipment         General transfer comment: Cues for hand placement, forward weight shifting and assistance to boost into standing.  Bed height elevated to improve ease.  Ambulation/Gait Ambulation/Gait assistance: Min assist;+2 safety/equipment Gait Distance (Feet): 8 Feet Assistive device: Rolling walker (2 wheeled) Gait Pattern/deviations: Step-to pattern;Antalgic;Decreased stance time - left;Decreased stance time - right;Decreased stride length;Trunk flexed     General Gait Details: Cues for sequencing and weight shifting to improve  gt symmetry.  Close chair follow reports dizziness after 8 ft of gt training and required to sit.   Stairs             Wheelchair Mobility    Modified Rankin (Stroke Patients Only)       Balance Overall balance assessment: Needs assistance   Sitting balance-Leahy Scale: Good       Standing balance-Leahy Scale: Poor Standing balance comment: Heavy UE use                            Cognition   Behavior During Therapy: WFL for tasks assessed/performed Overall Cognitive Status: Within Functional Limits for tasks assessed                                        Exercises Total Joint Exercises Ankle Circles/Pumps: AROM;20 reps;Both;Supine Quad Sets: AROM;Right;10 reps;Supine Heel Slides: AAROM;Right;10 reps;Supine    General Comments        Pertinent Vitals/Pain Pain Assessment: 0-10 Pain Score: 8  Pain Descriptors / Indicators: Aching;Operative site guarding Pain Intervention(s): Monitored during session;Repositioned;Ice applied    Home Living                      Prior Function            PT Goals (current goals can now be found in the care plan section) Acute Rehab PT Goals Patient Stated Goal: to decrease pain  Potential to Achieve Goals: Good  Progress towards PT goals: Progressing toward goals    Frequency    7X/week      PT Plan Current plan remains appropriate    Co-evaluation              AM-PAC PT "6 Clicks" Mobility   Outcome Measure  Help needed turning from your back to your side while in a flat bed without using bedrails?: A Little Help needed moving from lying on your back to sitting on the side of a flat bed without using bedrails?: A Little Help needed moving to and from a bed to a chair (including a wheelchair)?: A Little Help needed standing up from a chair using your arms (e.g., wheelchair or bedside chair)?: A Little Help needed to walk in hospital room?: A Little Help needed  climbing 3-5 steps with a railing? : A Little 6 Click Score: 18    End of Session Equipment Utilized During Treatment: Gait belt Activity Tolerance: Treatment limited secondary to medical complications (Comment) Patient left: in bed;with call bell/phone within reach;with nursing/sitter in room Nurse Communication: Mobility status;Patient requests pain meds PT Visit Diagnosis: Muscle weakness (generalized) (M62.81);Unsteadiness on feet (R26.81);Difficulty in walking, not elsewhere classified (R26.2)     Time: 6295-2841 PT Time Calculation (min) (ACUTE ONLY): 21 min  Charges:  $Gait Training: 8-22 mins                     Governor Rooks, PTA Acute Rehabilitation Services Pager [phone] Office [phone]     Haelie Clapp Eli Hose 11/12/2018, 12:03 PM

## 2018-11-12 NOTE — Progress Notes (Signed)
Physical Therapy Treatment Patient Details Name: Sandra Shea MRN: [id number] DOB: Nov 23, 1965 Today's Date: 11/12/2018    History of Present Illness Pt is a 53 y/o female s/p R TKA. PMH includes HTN.     PT Comments    Pt performed progression of mobility with max cues for encouragement.  She continues to present with self limiting behavior.  Progressed gt trial to 40 ft with close chair follow.  Pt performed seated LAQs and then returned to CPM 0-69 degrees.  Will f/u per POC for progression of functional mobility.     Follow Up Recommendations  Follow surgeon's recommendation for DC plan and follow-up therapies;Supervision for mobility/OOB     Equipment Recommendations  None recommended by PT    Recommendations for Other Services       Precautions / Restrictions Precautions Precautions: Knee Precaution Booklet Issued: No Restrictions Weight Bearing Restrictions: Yes RLE Weight Bearing: Weight bearing as tolerated    Mobility  Bed Mobility Overal bed mobility: Needs Assistance Bed Mobility: Supine to Sit;Sit to Supine     Supine to sit: Min assist Sit to supine: Min assist   General bed mobility comments: Min assistance for trunk elevation and to lift LEs back to bed against gravity.  Pt extremely slow.  Transfers Overall transfer level: Needs assistance Equipment used: Rolling walker (2 wheeled) Transfers: Sit to/from Stand Sit to Stand: Mod assist;+2 safety/equipment         General transfer comment: Cues for hand placement, forward weight shifting and assistance to boost into standing.  Bed height elevated to improve ease.  Ambulation/Gait Ambulation/Gait assistance: Min assist;+2 safety/equipment Gait Distance (Feet): 40 Feet Assistive device: Rolling walker (2 wheeled) Gait Pattern/deviations: Step-to pattern;Antalgic;Decreased stance time - left;Decreased stance time - right;Decreased stride length;Trunk flexed     General Gait Details: Cues for  sequencing and weight shifting to improve gt symmetry.  Continues to required cues for L foot clearance and to progress gt distance.   Stairs             Wheelchair Mobility    Modified Rankin (Stroke Patients Only)       Balance Overall balance assessment: Needs assistance Sitting-balance support: No upper extremity supported;Feet supported Sitting balance-Leahy Scale: Good       Standing balance-Leahy Scale: Poor Standing balance comment: Heavy UE use                            Cognition Arousal/Alertness: Awake/alert Behavior During Therapy: WFL for tasks assessed/performed Overall Cognitive Status: Within Functional Limits for tasks assessed                                        Exercises Total Joint Exercises Ankle Circles/Pumps: AROM;20 reps;Both;Supine Quad Sets: AROM;Right;10 reps;Supine Heel Slides: AAROM;Right;10 reps;Supine Long Arc Quad: Right;Seated;AAROM;10 reps Goniometric ROM: 5-70 degrees R knee.    General Comments        Pertinent Vitals/Pain Pain Assessment: 0-10 Pain Score: 8  Pain Location: R knee (lateral part) Pain Descriptors / Indicators: Aching;Operative site guarding Pain Intervention(s): Monitored during session;Repositioned    Home Living                      Prior Function            PT Goals (current goals can now be found in  the care plan section) Acute Rehab PT Goals Patient Stated Goal: to decrease pain  Potential to Achieve Goals: Good Progress towards PT goals: Progressing toward goals    Frequency    7X/week      PT Plan Current plan remains appropriate    Co-evaluation              AM-PAC PT "6 Clicks" Mobility   Outcome Measure  Help needed turning from your back to your side while in a flat bed without using bedrails?: A Little Help needed moving from lying on your back to sitting on the side of a flat bed without using bedrails?: A Little Help needed  moving to and from a bed to a chair (including a wheelchair)?: A Little Help needed standing up from a chair using your arms (e.g., wheelchair or bedside chair)?: A Little Help needed to walk in hospital room?: A Little Help needed climbing 3-5 steps with a railing? : A Little 6 Click Score: 18    End of Session Equipment Utilized During Treatment: Gait belt Activity Tolerance: Treatment limited secondary to medical complications (Comment) Patient left: in bed;with call bell/phone within reach;with nursing/sitter in room Nurse Communication: Mobility status;Patient requests pain meds PT Visit Diagnosis: Muscle weakness (generalized) (M62.81);Unsteadiness on feet (R26.81);Difficulty in walking, not elsewhere classified (R26.2)     Time: 1749-4496 PT Time Calculation (min) (ACUTE ONLY): 24 min  Charges:  $Gait Training: 8-22 mins $Therapeutic Activity: 8-22 mins                     Governor Rooks, PTA Acute Rehabilitation Services Pager [phone] Office [phone]     Sandra Shea 11/12/2018, 3:00 PM

## 2018-11-12 NOTE — Progress Notes (Signed)
  Subjective: Sandra Shea is a 53 y.o. female s/p R TKA.  They are POD1.  Pt's pain is tolerable and improving since yesterday.  Pt denies numbness/tingling/weakness.  Pt has not ambulated since yesterday.  She had some dizziness when she tried to ambulate yesterday.  She will try again in PT today.    Objective: Vital signs in last 24 hours: Temp:  [97.2 F (36.2 C)-98.3 F (36.8 C)] 97.6 F (36.4 C) (07/17 0439) Pulse Rate:  [65-89] 82 (07/17 0439) Resp:  [10-18] 18 (07/17 0439) BP: (107-154)/(66-92) 107/66 (07/17 0439) SpO2:  [96 %-100 %] 96 % (07/17 0439) Weight:  [126.7 kg] 126.7 kg (07/16 1029)  Intake/Output from previous day: 07/16 0701 - 07/17 0700 In: 2787.3 [P.O.:590; I.V.:2000; IV Piggyback:197.3] Out: 2500 [Urine:2450; Blood:50] Intake/Output this shift: No intake/output data recorded.  Exam:  No gross blood or drainage overlying the dressing 2+ DP pulse Able to dorsiflex and plantarflex the R foot   Labs: No results for input(s): HGB in the last 72 hours. No results for input(s): WBC, RBC, HCT, PLT in the last 72 hours. No results for input(s): NA, K, CL, CO2, BUN, CREATININE, GLUCOSE, CALCIUM in the last 72 hours. No results for input(s): LABPT, INR in the last 72 hours.  Assessment/Plan: Pt is POD1 s/p R TKA.    -Plan to discharge home tomorrow pending patient's pain and PT eval  -WBAT with a walker  -Okay to shower, dressing is waterproof.  Cautioned patient against soaking dressing in bath/pool/body of water  -Cautioned patient against using a pillow under their knee.  -Use the CPM machine at least 3 times per day for one hour each time, increasing the degrees daily.     Sandra Shea 11/12/2018, 9:38 AM

## 2018-11-12 NOTE — Plan of Care (Signed)
  Problem: Education: Goal: Knowledge of General Education information will improve Description Including pain rating scale, medication(s)/side effects and non-pharmacologic comfort measures Outcome: Progressing   Problem: Nutrition: Goal: Adequate nutrition will be maintained Outcome: Progressing   Problem: Pain Managment: Goal: General experience of comfort will improve Outcome: Progressing   

## 2018-11-13 MED ORDER — HYDROCODONE-ACETAMINOPHEN 5-325 MG PO TABS: 1.0000 | ORAL_TABLET | ORAL | 0 refills | Status: DC | PRN

## 2018-11-13 MED ORDER — HYDROCODONE-ACETAMINOPHEN 5-325 MG PO TABS
1.0000 | ORAL_TABLET | ORAL | Status: DC | PRN
  Administered 2018-11-13: 1 via ORAL
  Filled 2018-11-13: qty 1

## 2018-11-13 MED ORDER — DOCUSATE SODIUM 100 MG PO CAPS: 100.0000 mg | ORAL_CAPSULE | Freq: Two times a day (BID) | ORAL | 0 refills | Status: AC

## 2018-11-13 MED ORDER — METHOCARBAMOL 500 MG PO TABS: 500.0000 mg | ORAL_TABLET | Freq: Four times a day (QID) | ORAL | 0 refills | Status: DC | PRN

## 2018-11-13 MED ORDER — ASPIRIN 81 MG PO CHEW: 81.0000 mg | CHEWABLE_TABLET | Freq: Two times a day (BID) | ORAL | 0 refills | Status: AC

## 2018-11-13 NOTE — Progress Notes (Signed)
[facility] Nurse to be D/C'd  per MD order. Discussed with the patient and all questions fully answered.  VSS, Skin clean, dry and intact without evidence of skin break down, no evidence of skin tears noted.  IV catheter discontinued intact. Site without signs and symptoms of complications. Dressing and pressure applied.  An After Visit Summary was printed and given to the patient. Patient received prescription.  D/c education completed with patient/family including follow up instructions, medication list, d/c activities limitations if indicated, with other d/c instructions as indicated by MD - patient able to verbalize understanding, all questions fully answered.   Patient instructed to return to ED, call 911, or call MD for any changes in condition.   Patient to be escorted via [place], and D/C home via private auto.

## 2018-11-13 NOTE — Progress Notes (Signed)
Physical Therapy Treatment Patient Details Name: Sandra Shea MRN: [id number] DOB: November 07, 1965 Today's Date: 11/13/2018    History of Present Illness Pt is a 53 y/o female s/p R TKA. PMH includes HTN.     PT Comments    Pt is showing slow progress towards goals and is currently limited by pain. She is heavily reliant on bed rails and trapeze bar during bed mobilities and requires seated rest break after ambulating 75 ft. Notified RN of need for 2nd treatment today before d/c. Plan to focus on bed mobilities with bed flat and no bed rails to simulate home environment prior to pt's return home.     Follow Up Recommendations  Follow surgeon's recommendation for DC plan and follow-up therapies;Supervision for mobility/OOB     Equipment Recommendations  None recommended by PT    Recommendations for Other Services       Precautions / Restrictions Precautions Precautions: Knee Precaution Booklet Issued: No Precaution Comments: Reviewed knee precautions with pt. as pt's knee was bend in bed Restrictions Weight Bearing Restrictions: Yes RLE Weight Bearing: Weight bearing as tolerated    Mobility  Bed Mobility Overal bed mobility: Needs Assistance Bed Mobility: Supine to Sit     Supine to sit: Min assist     General bed mobility comments: min A with heavy use of bed rails, HOB elevate, and trapeez bar.   Transfers Overall transfer level: Needs assistance Equipment used: Rolling walker (2 wheeled) Transfers: Sit to/from Stand Sit to Stand: +2 safety/equipment;Min assist         General transfer comment: From elevated surface. Cues for safe hand placement and "nose over toes". Min A to boost up and steady.   Ambulation/Gait Ambulation/Gait assistance: Min assist;+2 safety/equipment Gait Distance (Feet): 75 Feet Assistive device: Rolling walker (2 wheeled) Gait Pattern/deviations: Step-to pattern;Antalgic;Decreased stance time - left;Decreased stance time - right;Decreased  stride length;Trunk flexed     General Gait Details: Chair to follow. Cues for postrual control and increased stride length on the L. Pt reports feeling more stable with KI.   Stairs             Wheelchair Mobility    Modified Rankin (Stroke Patients Only)       Balance Overall balance assessment: Needs assistance Sitting-balance support: No upper extremity supported;Feet supported Sitting balance-Leahy Scale: Good       Standing balance-Leahy Scale: Poor Standing balance comment: Heavy UE use                            Cognition Arousal/Alertness: Awake/alert Behavior During Therapy: WFL for tasks assessed/performed Overall Cognitive Status: Within Functional Limits for tasks assessed                                        Exercises      General Comments General comments (skin integrity, edema, etc.): Able to perform peri care with min A to steady      Pertinent Vitals/Pain Pain Assessment: Faces Faces Pain Scale: Hurts whole lot Pain Location: R knee (lateral part) Pain Descriptors / Indicators: Aching;Operative site guarding Pain Intervention(s): Monitored during session;Limited activity within patient's tolerance;Repositioned    Home Living                      Prior Function  PT Goals (current goals can now be found in the care plan section) Acute Rehab PT Goals Patient Stated Goal: to decrease pain  PT Goal Formulation: With patient Time For Goal Achievement: 11/25/18 Potential to Achieve Goals: Good Progress towards PT goals: Progressing toward goals    Frequency    7X/week      PT Plan Current plan remains appropriate    Co-evaluation              AM-PAC PT "6 Clicks" Mobility   Outcome Measure  Help needed turning from your back to your side while in a flat bed without using bedrails?: A Little Help needed moving from lying on your back to sitting on the side of a flat bed  without using bedrails?: A Little Help needed moving to and from a bed to a chair (including a wheelchair)?: A Little Help needed standing up from a chair using your arms (e.g., wheelchair or bedside chair)?: A Little Help needed to walk in hospital room?: A Little Help needed climbing 3-5 steps with a railing? : A Lot 6 Click Score: 17    End of Session Equipment Utilized During Treatment: Gait belt Activity Tolerance: Patient tolerated treatment well;Patient limited by pain Patient left: with call bell/phone within reach;in chair Nurse Communication: Mobility status(need for PM treatment) PT Visit Diagnosis: Muscle weakness (generalized) (M62.81);Unsteadiness on feet (R26.81);Difficulty in walking, not elsewhere classified (R26.2)     Time: 0093-8182 PT Time Calculation (min) (ACUTE ONLY): 23 min  Charges:  $Gait Training: 23-37 mins                     Benjiman Core, [place] Pager [phone] Acute Rehab   Allena Katz 11/13/2018, 1:25 PM

## 2018-11-13 NOTE — Plan of Care (Signed)
  Problem: Education: Goal: Knowledge of General Education information will improve Description: Including pain rating scale, medication(s)/side effects and non-pharmacologic comfort measures Outcome: Completed/Met   Problem: Education: Goal: Knowledge of General Education information will improve Description: Including pain rating scale, medication(s)/side effects and non-pharmacologic comfort measures Outcome: Completed/Met   Problem: Health Behavior/Discharge Planning: Goal: Ability to manage health-related needs will improve Outcome: Completed/Met   Problem: Clinical Measurements: Goal: Ability to maintain clinical measurements within normal limits will improve Outcome: Completed/Met Goal: Will remain free from infection Outcome: Completed/Met Goal: Diagnostic test results will improve Outcome: Completed/Met Goal: Respiratory complications will improve Outcome: Completed/Met Goal: Cardiovascular complication will be avoided Outcome: Completed/Met   Problem: Activity: Goal: Risk for activity intolerance will decrease Outcome: Completed/Met   Problem: Nutrition: Goal: Adequate nutrition will be maintained Outcome: Completed/Met   Problem: Coping: Goal: Level of anxiety will decrease Outcome: Completed/Met   Problem: Elimination: Goal: Will not experience complications related to bowel motility Outcome: Completed/Met Goal: Will not experience complications related to urinary retention Outcome: Completed/Met   Problem: Pain Managment: Goal: General experience of comfort will improve Outcome: Completed/Met   Problem: Safety: Goal: Ability to remain free from injury will improve Outcome: Completed/Met   Problem: Skin Integrity: Goal: Risk for impaired skin integrity will decrease Outcome: Completed/Met

## 2018-11-13 NOTE — Progress Notes (Signed)
Today's skilled session focused on bed mobilities to simulate home environment and prepare for d/c home, plus gait progression. Pt was drowsy on arrival to room but willing to participate with PT. Current plan remains appropriate.   Benjiman Core, [place] Pager [phone] Acute Rehab   11/13/18 1500  PT Visit Information  Last PT Received On 11/13/18  Assistance Needed +2  History of Present Illness Pt is a 53 y/o female s/p R TKA. PMH includes HTN.   Subjective Data  Patient Stated Goal to decrease pain   Precautions  Precautions Knee  Precaution Booklet Issued No  Restrictions  Weight Bearing Restrictions Yes  RLE Weight Bearing WBAT  Pain Assessment  Pain Assessment Faces  Faces Pain Scale 6  Pain Location R knee (lateral part)  Pain Descriptors / Indicators Aching;Operative site guarding  Cognition  Arousal/Alertness Awake/alert  Behavior During Therapy WFL for tasks assessed/performed  Overall Cognitive Status Within Functional Limits for tasks assessed  Bed Mobility  Overal bed mobility Needs Assistance  Bed Mobility Supine to Sit  Supine to sit Mod assist  Sit to supine Min assist  General bed mobility comments Bed flat with bed rails down to simulate home enviroment. Mod A for LE management and HHA to allow pt to pull trunk fully upright. Min A to progress back to supine.  Transfers  Overall transfer level Needs assistance  Equipment used Rolling walker (2 wheeled)  Transfers Sit to/from Stand  Sit to Stand Min assist  General transfer comment Pt unable to rise on first attempt with bed in low position. Once bed elevated pt required only min A to rise into standing with cues for technique and hand placement.  Ambulation/Gait  Ambulation/Gait assistance Min assist  Gait Distance (Feet) 50 Feet  Assistive device Rolling walker (2 wheeled)  Gait Pattern/deviations Step-to pattern;Antalgic;Decreased stance time - left;Decreased stance time - right;Decreased stride  length;Trunk flexed  General Gait Details Pt with slow shuffling gait. VC required for postural control and foot clearence on the L.  No overt LOB  Balance  Overall balance assessment Needs assistance  Sitting-balance support No upper extremity supported;Feet supported  Sitting balance-Leahy Scale Good  Standing balance-Leahy Scale Poor  Standing balance comment Heavy UE use  PT - End of Session  Equipment Utilized During Treatment Gait belt  Activity Tolerance Patient tolerated treatment well;Patient limited by pain  Patient left with call bell/phone within reach;in chair  Nurse Communication Mobility status  CPM Right Knee  CPM Right Knee On  Right Knee Flexion (Degrees) 55  Right Knee Extension (Degrees) 0   PT - Assessment/Plan  PT Plan Current plan remains appropriate  PT Visit Diagnosis Muscle weakness (generalized) (M62.81);Unsteadiness on feet (R26.81);Difficulty in walking, not elsewhere classified (R26.2)  PT Frequency (ACUTE ONLY) 7X/week  Follow Up Recommendations Follow surgeon's recommendation for DC plan and follow-up therapies;Supervision for mobility/OOB  PT equipment None recommended by PT  AM-PAC PT "6 Clicks" Mobility Outcome Measure (Version 2)  Help needed turning from your back to your side while in a flat bed without using bedrails? 3  Help needed moving from lying on your back to sitting on the side of a flat bed without using bedrails? 3  Help needed moving to and from a bed to a chair (including a wheelchair)? 3  Help needed standing up from a chair using your arms (e.g., wheelchair or bedside chair)? 3  Help needed to walk in hospital room? 3  Help needed climbing 3-5 steps with a railing?  2  6 Click Score 17  Consider Recommendation of Discharge To: Home with [facility]  PT Goal Progression  Progress towards PT goals Progressing toward goals  Acute Rehab PT Goals  PT Goal Formulation With patient  Time For Goal Achievement 11/25/18  Potential to Achieve  Goals Good  PT Time Calculation  PT Start Time (ACUTE ONLY) 1507  PT Stop Time (ACUTE ONLY) 1530  PT Time Calculation (min) (ACUTE ONLY) 23 min  PT General Charges  $$ ACUTE PT VISIT 1 Visit  PT Treatments  $Gait Training 8-22 mins  $Therapeutic Activity 8-22 mins

## 2018-11-13 NOTE — Anesthesia Postprocedure Evaluation (Signed)
Anesthesia Post Note  Patient: Sandra Shea  Procedure(s) Performed: RIGHT TOTAL KNEE ARTHROPLASTY (Right Knee)     Patient location during evaluation: PACU Anesthesia Type: Spinal Level of consciousness: awake and alert, oriented and patient cooperative Pain management: pain level controlled Vital Signs Assessment: post-procedure vital signs reviewed and stable Respiratory status: spontaneous breathing, nonlabored ventilation and respiratory function stable Cardiovascular status: blood pressure returned to baseline and stable Postop Assessment: no apparent nausea or vomiting, spinal receding and patient able to bend at knees Anesthetic complications: no    Last Vitals:  Vitals:   11/12/18 1750 11/12/18 2103  BP: (!) 160/76 (!) 143/68  Pulse: 91 (!) 106  Resp: 18 18  Temp: 36.8 C 37.1 C  SpO2: 98% 95%    Last Pain:  Vitals:   11/12/18 2127  TempSrc:   PainSc: 3                  Merryl Buckels,E. [place]chise Dinneen

## 2018-11-13 NOTE — Progress Notes (Signed)
  Subjective: Patient stable.  She was having a little dizziness with oxycodone.  She is able to ambulate some in the hall with therapy   Objective: Vital signs in last 24 hours: Temp:  [97.7 F (36.5 C)-98.7 F (37.1 C)] 98.3 F (36.8 C) (07/18 0441) Pulse Rate:  [91-106] 101 (07/18 0441) Resp:  [17-20] 17 (07/18 0441) BP: (122-160)/(68-81) 124/81 (07/18 0441) SpO2:  [95 %-98 %] 97 % (07/18 0441)  Intake/Output from previous day: 07/17 0701 - 07/18 0700 In: 480 [P.O.:480] Out: 700 [Urine:700] Intake/Output this shift: Total I/O In: 118 [P.O.:118] Out: -   Exam:  No cellulitis present Compartment soft  Labs: No results for input(s): HGB in the last 72 hours. No results for input(s): WBC, RBC, HCT, PLT in the last 72 hours. No results for input(s): NA, K, CL, CO2, BUN, CREATININE, GLUCOSE, CALCIUM in the last 72 hours. No results for input(s): LABPT, INR in the last 72 hours.  Assessment/Plan: Plan at this time is discharge home at 1:00 today.  I would like her to have therapy this morning.  I think she should use the knee immobilizer when she is walking by herself just because of her inability to do straight leg raising yet.  Plan to discontinue oxycodone and try her on hydrocodone instead.   Landry Dyke Maral Lampe 11/13/2018, 8:42 AM

## 2018-11-16 ENCOUNTER — Encounter (HOSPITAL_COMMUNITY): Payer: Self-pay | Admitting: Orthopedic Surgery

## 2018-11-26 ENCOUNTER — Telehealth: Payer: Self-pay | Admitting: Radiology

## 2018-11-26 ENCOUNTER — Ambulatory Visit (INDEPENDENT_AMBULATORY_CARE_PROVIDER_SITE_OTHER): Payer: BC Managed Care – PPO

## 2018-11-26 ENCOUNTER — Ambulatory Visit (INDEPENDENT_AMBULATORY_CARE_PROVIDER_SITE_OTHER): Payer: BC Managed Care – PPO | Admitting: Orthopedic Surgery

## 2018-11-26 ENCOUNTER — Encounter: Payer: Self-pay | Admitting: Orthopedic Surgery

## 2018-11-26 ENCOUNTER — Other Ambulatory Visit: Payer: Self-pay

## 2018-11-26 DIAGNOSIS — M1711 Unilateral primary osteoarthritis, right knee: Secondary | ICD-10-CM | POA: Diagnosis not present

## 2018-11-26 MED ORDER — HYDROCODONE-ACETAMINOPHEN 5-325 MG PO TABS: 1.0000 | ORAL_TABLET | Freq: Four times a day (QID) | ORAL | 0 refills | Status: DC | PRN

## 2018-11-26 NOTE — Progress Notes (Signed)
   Post-Op Visit Note   Patient: Sandra Shea           Date of Birth: 1965/12/29           MRN: [id number] Visit Date: 11/26/2018 PCP: Kennyth Arnold, FNP   Assessment & Plan:  Chief Complaint:  Chief Complaint  Patient presents with  . Right Knee - Routine Post Op    11/11/2018 Right TKA   Visit Diagnoses:  1. Unilateral primary osteoarthritis, right knee     Plan: Patient is a 53 year old female status post right TKA on 11/11/2018.  Patient is doing well today.   her home health PT ended this past week.  Her knee is stable in the clinic today and she is able to get her knee straight.  She has 76 degrees of flexion according to the last note from therapy.  Patient has been using the CPM machine as instructed.  X-rays today in the clinic reviewed with the patient that revealed the implant is well aligned and in good position.  No evidence of any fracture or dislocation.  Patient will now transition to outpatient therapy and return to the clinic in 3 weeks.  Follow-Up Instructions: No follow-ups on file.   Orders:  Orders Placed This Encounter  Procedures  . XR Knee 1-2 Views Right  . Ambulatory referral to Physical Therapy   Meds ordered this encounter  Medications  . HYDROcodone-acetaminophen (NORCO/VICODIN) 5-325 MG tablet    Sig: Take 1 tablet by mouth every 6 (six) hours as needed for severe pain.    Dispense:  30 tablet    Refill:  0    Imaging: No results found.  PMFS History: Patient Active Problem List   Diagnosis Date Noted  . Arthritis of knee 11/11/2018  . Osteoarthritis of left knee 10/18/2014  . Unspecified essential hypertension 09/24/2012  . Obesity, unspecified 09/24/2012   Past Medical History:  Diagnosis Date  . Arthritis   . Hypertension     No family history on file.  Past Surgical History:  Procedure Laterality Date  . KNEE SURGERY    . TOTAL KNEE ARTHROPLASTY Right 11/11/2018  . TOTAL KNEE ARTHROPLASTY Right 11/11/2018   Procedure: RIGHT  TOTAL KNEE ARTHROPLASTY;  Surgeon: Meredith Pel, MD;  Location: [facility];  Service: Orthopedics;  Laterality: Right;   Social History   Occupational History  . Not on file  Tobacco Use  . Smoking status: Never Smoker  . Smokeless tobacco: Never Used  Substance and Sexual Activity  . Alcohol use: Yes    Comment: socially  . Drug use: No  . Sexual activity: Not on file

## 2018-11-26 NOTE — Telephone Encounter (Signed)
Called to inform patient that form is ready for pickup at front desk.

## 2018-11-28 NOTE — Discharge Summary (Signed)
Physician Discharge Summary      Patient ID: Sandra Shea MRN: [id number] DOB/AGE: [id number] y.o.  Admit date: 11/11/2018 Discharge date: 11/13/2018  Admission Diagnoses:  Active Problems:   Arthritis of knee   Discharge Diagnoses:  Same  Surgeries: Procedure(s): RIGHT TOTAL KNEE ARTHROPLASTY on 11/11/2018   Consultants:   Discharged Condition: Stable  Hospital Course: Sandra Shea is an 53 y.o. female who was admitted 11/11/2018 with a chief complaint of right knee pain, and found to have a diagnosis of right knee arthritis.  They were brought to the operating room on 11/11/2018 and underwent the above named procedures.  Patient tolerated suture well with immediate complication.  She was started with CPM machine and physical therapy.  She made good progress and was ambulating by postop day #2.  She is discharged home in good condition and will follow-up in 10 days.  Antibiotics given:  Anti-infectives (From admission, onward)   Start     Dose/Rate Route Frequency Ordered Stop   11/11/18 2000  ceFAZolin (ANCEF) IVPB 2g/100 mL premix     2 g 200 mL/hr over 30 Minutes Intravenous Every 6 hours 11/11/18 1728 11/12/18 0305   11/11/18 0600  ceFAZolin (ANCEF) 3 g in dextrose 5 % 50 mL IVPB     3 g 100 mL/hr over 30 Minutes Intravenous On call to O.R. 11/10/18 0750 11/11/18 1227    .  Recent vital signs:  Vitals:   11/13/18 0441 11/13/18 1216  BP: 124/81 130/81  Pulse: (!) 101 99  Resp: 17   Temp: 98.3 F (36.8 C) (!) 97.4 F (36.3 C)  SpO2: 97% 97%    Recent laboratory studies:  Results for orders placed or performed during the hospital encounter of 11/08/18  Surgical pcr screen   Specimen: Nasal Mucosa; Nasal Swab  Result Value Ref Range   MRSA, PCR NEGATIVE NEGATIVE   Staphylococcus aureus NEGATIVE NEGATIVE  Urine culture   Specimen: Urine, Clean Catch  Result Value Ref Range   Specimen Description URINE, CLEAN CATCH    Special Requests NONE    Culture (A)      <10,000 COLONIES/mL INSIGNIFICANT GROWTH Performed at [facility] [address], [place], [place] [address]    Report Status 11/09/2018 FINAL   Basic metabolic panel  Result Value Ref Range   Sodium 136 135 - 145 mmol/L   Potassium 3.6 3.5 - 5.1 mmol/L   Chloride 102 98 - 111 mmol/L   CO2 23 22 - 32 mmol/L   Glucose, Bld 89 70 - 99 mg/dL   BUN 9 6 - 20 mg/dL   Creatinine, Ser 1.02 (H) 0.44 - 1.00 mg/dL   Calcium 9.2 8.9 - 10.3 mg/dL   GFR calc non Af Amer >60 >60 mL/min   GFR calc Af Amer >60 >60 mL/min   Anion gap 11 5 - 15  CBC  Result Value Ref Range   WBC 8.5 4.0 - 10.5 K/uL   RBC 4.96 3.87 - 5.11 MIL/uL   Hemoglobin 12.8 12.0 - 15.0 g/dL   HCT 41.9 36.0 - 46.0 %   MCV 84.5 80.0 - 100.0 fL   MCH 25.8 (L) 26.0 - 34.0 pg   MCHC 30.5 30.0 - 36.0 g/dL   RDW 13.5 11.5 - 15.5 %   Platelets 227 150 - 400 K/uL   nRBC 0.0 0.0 - 0.2 %  Urinalysis, Routine w reflex microscopic  Result Value Ref Range   Color, Urine YELLOW YELLOW  APPearance HAZY (A) CLEAR   Specific Gravity, Urine 1.024 1.005 - 1.030   pH 5.0 5.0 - 8.0   Glucose, UA NEGATIVE NEGATIVE mg/dL   Hgb urine dipstick MODERATE (A) NEGATIVE   Bilirubin Urine NEGATIVE NEGATIVE   Ketones, ur NEGATIVE NEGATIVE mg/dL   Protein, ur NEGATIVE NEGATIVE mg/dL   Nitrite NEGATIVE NEGATIVE   Leukocytes,Ua TRACE (A) NEGATIVE   RBC / HPF 0-5 0 - 5 RBC/hpf   WBC, UA 6-10 0 - 5 WBC/hpf   Bacteria, UA RARE (A) NONE SEEN   Squamous Epithelial / LPF 0-5 0 - 5   Mucus PRESENT    Hyaline Casts, UA PRESENT    Ca Oxalate Crys, UA PRESENT     Discharge Medications:   Allergies as of 11/13/2018      Reactions   Sulfa Antibiotics Hives      Medication List    STOP taking these medications   acetaminophen 500 MG tablet Commonly known as: TYLENOL   hydrochlorothiazide 12.5 MG tablet Commonly known as: HYDRODIURIL   meloxicam 15 MG tablet Commonly known as: MOBIC   naproxen 500 MG tablet Commonly known as:  NAPROSYN     TAKE these medications   aspirin 81 MG chewable tablet Chew 1 tablet (81 mg total) by mouth 2 (two) times daily. Notes to patient: tonight   BIOFREEZE EX Apply 1 application topically 2 (two) times daily as needed (back pain).   docusate sodium 100 MG capsule Commonly known as: COLACE Take 1 capsule (100 mg total) by mouth 2 (two) times daily. Notes to patient: tonight   ibuprofen 200 MG tablet Commonly known as: ADVIL Take 400 mg by mouth every 6 (six) hours as needed for moderate pain.   methocarbamol 500 MG tablet Commonly known as: ROBAXIN Take 1 tablet (500 mg total) by mouth every 6 (six) hours as needed for muscle spasms.       Diagnostic Studies: Xr Knee 1-2 Views Right  Result Date: 11/26/2018 AP lateral right knee reviewed.  Total knee prosthesis in good position alignment with no complicating features.  Left knee has severe arthritis with varus alignment   Disposition:   Discharge Instructions    Call MD / Call 911   Complete by: As directed    If you experience chest pain or shortness of breath, CALL 911 and be transported to the hospital emergency room.  If you develope a fever above 101 F, pus (white drainage) or increased drainage or redness at the wound, or calf pain, call your surgeon's office.   Constipation Prevention   Complete by: As directed    Drink plenty of fluids.  Prune juice may be helpful.  You may use a stool softener, such as Colace (over the counter) 100 mg twice a day.  Use MiraLax (over the counter) for constipation as needed.   Diet - low sodium heart healthy   Complete by: As directed    Discharge instructions   Complete by: As directed    CPM 1 hour 3 times a day minimum Okay to shower dressing waterproof Use the blue foam boot to help make the leg straight Work on straight leg raises to get the quad muscle stronger Take 1 baby aspirin twice a day to prevent blood clots Use the knee immobilizer when you are walking  with a walker at home.   Increase activity slowly as tolerated   Complete by: As directed          Signed: G  Alphonzo Severance 11/28/2018, 12:14 PM

## 2018-11-30 ENCOUNTER — Encounter: Payer: Self-pay | Admitting: Physical Therapy

## 2018-11-30 ENCOUNTER — Other Ambulatory Visit: Payer: Self-pay

## 2018-11-30 ENCOUNTER — Ambulatory Visit: Payer: BC Managed Care – PPO | Attending: Orthopedic Surgery | Admitting: Physical Therapy

## 2018-11-30 DIAGNOSIS — M6281 Muscle weakness (generalized): Secondary | ICD-10-CM | POA: Diagnosis present

## 2018-11-30 DIAGNOSIS — M25561 Pain in right knee: Secondary | ICD-10-CM | POA: Insufficient documentation

## 2018-11-30 DIAGNOSIS — M25661 Stiffness of right knee, not elsewhere classified: Secondary | ICD-10-CM | POA: Insufficient documentation

## 2018-11-30 DIAGNOSIS — R262 Difficulty in walking, not elsewhere classified: Secondary | ICD-10-CM | POA: Diagnosis present

## 2018-11-30 NOTE — Therapy (Signed)
[facility] [address]  [place], [place], [address] Phone: [phone]   Fax:  [phone]  Physical Therapy Evaluation  Patient Details  Name: Sandra Shea MRN: [id number] Date of Birth: 1965-06-11 Referring Provider (PT): Meredith Pel, MD   Encounter Date: 11/30/2018  PT End of Session - 11/30/18 1442    Visit Number  1    Number of Visits  13    Date for PT Re-Evaluation  01/11/19    Authorization Type  State BCBS    PT Start Time  0263    PT Stop Time  1438    PT Time Calculation (min)  42 min    Activity Tolerance  Patient tolerated treatment well;Patient limited by pain    Behavior During Therapy  [facility] for tasks assessed/performed       Past Medical History:  Diagnosis Date  . Arthritis   . Hypertension     Past Surgical History:  Procedure Laterality Date  . KNEE SURGERY    . TOTAL KNEE ARTHROPLASTY Right 11/11/2018  . TOTAL KNEE ARTHROPLASTY Right 11/11/2018   Procedure: RIGHT TOTAL KNEE ARTHROPLASTY;  Surgeon: Meredith Pel, MD;  Location: [facility];  Service: Orthopedics;  Laterality: Right;    There were no vitals filed for this visit.   Subjective Assessment - 11/30/18 1357    Subjective  Patient reports undergoing R TKA on 11/11/18. Had HHPT for 2 weeks. Using RW most often, SPC occasionally. Reports pain levels have been fine. Since this Saturday, she has noticed increased tightness in R knee after she went out to a friend's house. Has CPM for about 1 week longer. Still having trouble with bending the knee back. Would like to walk without AD and drive her car. Has been feeling tired since surgery. Denies fever, chills, night sweats.    Pertinent History  HTN    Limitations  Sitting;Lifting;Standing;Walking;House hold activities    How long can you sit comfortably?  1 hour    How long can you stand comfortably?  20-30 min leaning over counter    How long can you walk comfortably?  7  min, limited by fatigue    Patient Stated Goals  get off the walker    Currently in Pain?  No/denies    Pain Score  0-No pain    Pain Location  Knee    Pain Orientation  Right    Pain Descriptors / Indicators  --   nerve pain   Pain Type  Acute pain;Surgical pain         [facility] PT Assessment - 11/30/18 1407      Assessment   Medical Diagnosis  Unilateral primary OA; s/p R TKA    Referring Provider (PT)  Meredith Pel, MD    Onset Date/Surgical Date  11/11/18    Next MD Visit  12/17/18    Prior Therapy  yes      Precautions   Precautions  None      Balance Screen   Has the patient fallen in the past 6 months  No    Has the patient had a decrease in activity level because of a fear of falling?   No    Is the patient reluctant to leave their home because of a fear of falling?   No      Home Social worker  Private residence    Living Arrangements  Children;Parent  Available Help at Discharge  Family    Type of Hope to enter    Entrance Stairs-Number of Steps  1    Entrance Stairs-Rails  None    Home Layout  One level    [place] - 2 wheels;Cane - single point      Prior Function   Level of Independence  Independent    Vocation  Full time employment    Vocation Requirements  driving a school bus    Leisure  riding motorcycle      Cognition   Overall Cognitive Status  Within Functional Limits for tasks assessed      Observation/Other Assessments   Observations  R calf supple and nontender; incision covered in steri strips      Sensation   Light Touch  Appears Intact      Coordination   Gross Motor Movements are Fluid and Coordinated  Yes      Posture/Postural Control   Posture/Postural Control  Postural limitations    Postural Limitations  Rounded Shoulders;Posterior pelvic tilt;Weight shift right;Weight shift left      ROM / Strength   AROM / PROM / Strength  AROM;PROM;Strength      AROM    AROM Assessment Site  Knee    Right/Left Knee  Right;Left    Right Knee Extension  0    Right Knee Flexion  62    Left Knee Extension  0    Left Knee Flexion  109      PROM   PROM Assessment Site  Knee    Right/Left Knee  Right;Left    Right Knee Extension  -3    Right Knee Flexion  73    Left Knee Extension  0    Left Knee Flexion  116      Strength   Strength Assessment Site  Hip;Knee;Ankle    Right/Left Hip  Right;Left    Right Hip Flexion  2+/5    Right Hip ABduction  4-/5    Right Hip ADduction  4+/5    Left Hip Flexion  5/5    Left Hip ABduction  4-/5    Left Hip ADduction  4+/5    Right/Left Knee  Right;Left    Right Knee Flexion  3+/5    Right Knee Extension  3+/5    Left Knee Flexion  4+/5    Left Knee Extension  4+/5    Right/Left Ankle  Right;Left    Right Ankle Dorsiflexion  4+/5    Right Ankle Plantar Flexion  4/5    Left Ankle Dorsiflexion  4+/5    Left Ankle Plantar Flexion  4/5      Palpation   Patella mobility  L patellar hypomobility in all directionsl WFL in R patella    Palpation comment  R distal quad slightly tight and edematous; mild TTP over superolateral knee      Ambulation/Gait   Assistive device  Rolling walker    Gait Pattern  Step-to pattern;Step-through pattern;Decreased stance time - right;Decreased hip/knee flexion - right;Decreased weight shift to right;Decreased step length - left;Poor foot clearance - left;Poor foot clearance - right;Trunk flexed   slight in-toeing   Ambulation Surface  Level;Indoor    Gait velocity  decreased                Objective measurements completed on examination: See above findings.  PT Education - 11/30/18 1442    Education Details  prognosis, POC, HEP    Person(s) Educated  Patient    Methods  Explanation;Demonstration;Tactile cues;Verbal cues;Handout    Comprehension  Verbalized understanding;Returned demonstration       PT Short Term Goals - 11/30/18 1452       PT SHORT TERM GOAL #1   Title  Patient to be independent with initial HEP.    Time  3    Period  Weeks    Status  New    Target Date  12/21/18        PT Long Term Goals - 11/30/18 1452      PT LONG TERM GOAL #1   Title  Patient to be independent with advanced HEP.    Time  6    Period  Weeks    Status  New    Target Date  01/11/19      PT LONG TERM GOAL #2   Title  Patient to demonstrate R knee AROM 0-120 degrees.    Time  6    Period  Weeks    Status  New    Target Date  01/11/19      PT LONG TERM GOAL #3   Title  Patient to demonstrate B LE strength >=4+/5.    Time  6    Period  Weeks    Status  New    Target Date  01/11/19      PT LONG TERM GOAL #4   Title  Patient to demonstrate R SLR without quad lag.    Time  6    Period  Weeks    Status  New    Target Date  01/11/19      PT LONG TERM GOAL #5   Title  Patient to demonstrate symmetrical step length, knee flexion, and weight shift with ambulation with LRAD.    Time  6    Period  Weeks    Status  New    Target Date  01/11/19             Plan - 11/30/18 1443    Clinical Impression Statement  Patient is a 53y/o F presenting to OPPT with c/o R knee pain s/p R TKA on 11/11/18. Had HHPT and CPM for 2 weeks. Now ambulating with RW most often, SPC occasionally. Having trouble with feeling of tightness with knee flexion. Would like to return to walking without AD and driving her car. Patient today with limited R knee flexion, decreased LE strength, and gait deviations. R distal quad slightly tight and edematous, but R calf supple and nontender. Educated patient on gentle stretching and strengthening HEP- patient reported understanding. Would benefit from skilled PT services 2x/week for 6 weeks to address aforementioned impairments.    Personal Factors and Comorbidities  Age;Fitness;Past/Current Experience;Profession;Time since onset of injury/illness/exacerbation;Comorbidity 1    Comorbidities  HTN     Examination-Activity Limitations  Locomotion Level;Lift;Hygiene/Grooming;Transfers;Dressing;Toileting;Carry;Stand;Caring for Others;Stairs;Squat;Bend;Sleep;Sit;Bed Mobility;Bathing    Examination-Participation Restrictions  Church;School;Cleaning;Shop;Community Activity;Driving;Yard Work;Interpersonal Relationship;Laundry;Meal Prep    Stability/Clinical Decision Making  Stable/Uncomplicated    Clinical Decision Making  Low    Rehab Potential  Good    PT Frequency  2x / week    PT Duration  6 weeks    PT Treatment/Interventions  ADLs/Self Care Home Management;Cryotherapy;Electrical Stimulation;Moist Heat;Balance training;Therapeutic exercise;Therapeutic activities;Functional mobility training;Stair training;Gait training;Ultrasound;Neuromuscular re-education;Patient/family education;Manual techniques;Vasopneumatic Device;Taping;Splinting;Energy conservation;Dry needling;Passive range of motion;Scar mobilization    PT Next Visit Plan  reassess HEP    Consulted and Agree with Plan of Care  Patient       Patient will benefit from skilled therapeutic intervention in order to improve the following deficits and impairments:  Decreased endurance, Hypomobility, Increased edema, Decreased scar mobility, Decreased activity tolerance, Pain, Decreased balance, Decreased mobility, Difficulty walking, Improper body mechanics, Decreased range of motion, Impaired flexibility, Postural dysfunction  Visit Diagnosis: 1. Acute pain of right knee   2. Stiffness of right knee, not elsewhere classified   3. Muscle weakness (generalized)   4. Difficulty in walking, not elsewhere classified        Problem List Patient Active Problem List   Diagnosis Date Noted  . Arthritis of knee 11/11/2018  . Osteoarthritis of left knee 10/18/2014  . Unspecified essential hypertension 09/24/2012  . Obesity, unspecified 09/24/2012     Janene Harvey, PT, DPT 11/30/18 2:57 PM   [facility] [address]  [place], [place], [address] Phone: [phone]   Fax:  [phone]  Name: Sandra Shea MRN: [id number] Date of Birth: Jun 11, 1965

## 2018-12-02 ENCOUNTER — Other Ambulatory Visit: Payer: Self-pay

## 2018-12-02 ENCOUNTER — Ambulatory Visit: Payer: BC Managed Care – PPO

## 2018-12-02 DIAGNOSIS — M25561 Pain in right knee: Secondary | ICD-10-CM | POA: Diagnosis not present

## 2018-12-02 DIAGNOSIS — M25661 Stiffness of right knee, not elsewhere classified: Secondary | ICD-10-CM

## 2018-12-02 DIAGNOSIS — M6281 Muscle weakness (generalized): Secondary | ICD-10-CM

## 2018-12-02 DIAGNOSIS — R262 Difficulty in walking, not elsewhere classified: Secondary | ICD-10-CM

## 2018-12-02 NOTE — Therapy (Signed)
[facility] [address]  [place], [place], [address] Phone: [phone]   Fax:  [phone]  Physical Therapy Treatment  Patient Details  Name: Sandra Shea MRN: [id number] Date of Birth: 1965/07/03 Referring Provider (PT): Meredith Pel, MD   Encounter Date: 12/02/2018  PT End of Session - 12/02/18 1712    Visit Number  2    Number of Visits  13    Date for PT Re-Evaluation  01/11/19    Authorization Type  State BCBS    PT Start Time  1701    PT Stop Time  1800    PT Time Calculation (min)  59 min    Activity Tolerance  Patient tolerated treatment well;Patient limited by pain    Behavior During Therapy  [facility] for tasks assessed/performed       Past Medical History:  Diagnosis Date  . Arthritis   . Hypertension     Past Surgical History:  Procedure Laterality Date  . KNEE SURGERY    . TOTAL KNEE ARTHROPLASTY Right 11/11/2018  . TOTAL KNEE ARTHROPLASTY Right 11/11/2018   Procedure: RIGHT TOTAL KNEE ARTHROPLASTY;  Surgeon: Meredith Pel, MD;  Location: [facility];  Service: Orthopedics;  Laterality: Right;    There were no vitals filed for this visit.  Subjective Assessment - 12/02/18 1711    Subjective  Pt. reporting she has been performing her HEP activities daily.    Pertinent History  HTN    Patient Stated Goals  get off the walker    Currently in Pain?  No/denies    Pain Score  0-No pain    Multiple Pain Sites  No                       [facility] Adult PT Treatment/Exercise - 12/02/18 0001      Ambulation/Gait   Ambulation/Gait  Yes    Ambulation/Gait Assistance  5: Supervision    Ambulation Distance (Feet)  90 Feet    Assistive device  None    Gait Pattern  Step-to pattern;Step-through pattern;Decreased stance time - right;Decreased hip/knee flexion - right;Decreased weight shift to right;Decreased step length - left;Poor foot clearance - left;Poor foot clearance - right;Trunk  flexed    Ambulation Surface  Level;Indoor      Knee/Hip Exercises: Stretches   Hip Flexor Stretch  Right;2 reps;30 seconds    Hip Flexor Stretch Limitations  strap       Knee/Hip Exercises: Aerobic   Recumbent Bike  Lvl 1, 3 min - partial revolutions for ROM     Nustep  Lvl 1, 4 min (UE/LE)      Knee/Hip Exercises: Standing   Heel Raises  Both;15 reps    Heel Raises Limitations  in RW    Functional Squat  15 reps;3 seconds    Functional Squat Limitations  counter     Other Standing Knee Exercises  LE clears 9" alternating LE clears x 10 reps at counter       Knee/Hip Exercises: Seated   Long Arc Quad  Right;10 reps;Strengthening    Long Arc Quad Limitations  Cues required for TKE x 1     Other Seated Knee/Hip Exercises  R fitter leg press (1 blue, 1 black) x 10 reps       Modalities   Modalities  Vasopneumatic      Vasopneumatic   Number Minutes Vasopneumatic   10 minutes  Vasopnuematic Location   Knee    Vasopneumatic Pressure  Medium    Vasopneumatic Temperature   coldest temp.                PT Short Term Goals - 12/02/18 1721      PT SHORT TERM GOAL #1   Title  Patient to be independent with initial HEP.    Time  3    Period  Weeks    Status  On-going    Target Date  12/21/18        PT Long Term Goals - 12/02/18 1721      PT LONG TERM GOAL #1   Title  Patient to be independent with advanced HEP.    Time  6    Period  Weeks    Status  On-going      PT LONG TERM GOAL #2   Title  Patient to demonstrate R knee AROM 0-120 degrees.    Time  6    Period  Weeks    Status  On-going      PT LONG TERM GOAL #3   Title  Patient to demonstrate B LE strength >=4+/5.    Time  6    Period  Weeks    Status  On-going      PT LONG TERM GOAL #4   Title  Patient to demonstrate R SLR without quad lag.    Time  6    Period  Weeks    Status  On-going      PT LONG TERM GOAL #5   Title  Patient to demonstrate symmetrical step length, knee flexion, and  weight shift with ambulation with LRAD.    Time  6    Period  Weeks    Status  On-going            Plan - 12/02/18 1721    Clinical Impression Statement  Pt. seen initially ambulating into clinic with RW.  Pt. with only mild R quad instability noted and able to maintain TKE with LAQ thus gait trained with SPC with pt. today.  Pt. requesting further training with SPC and verbalized plans to bring her cane in to next session.  Tolerated all LE strengthening activities well today with minor cueing required with functional squat for posterior weight shift.  Ended session with ice/compression to R knee to reduce post-exercise swelling and pain.    Personal Factors and Comorbidities  Age;Fitness;Past/Current Experience;Profession;Time since onset of injury/illness/exacerbation;Comorbidity 1    Rehab Potential  Good    PT Treatment/Interventions  ADLs/Self Care Home Management;Cryotherapy;Electrical Stimulation;Moist Heat;Balance training;Therapeutic exercise;Therapeutic activities;Functional mobility training;Stair training;Gait training;Ultrasound;Neuromuscular re-education;Patient/family education;Manual techniques;Vasopneumatic Device;Taping;Splinting;Energy conservation;Dry needling;Passive range of motion;Scar mobilization    Consulted and Agree with Plan of Care  Patient       Patient will benefit from skilled therapeutic intervention in order to improve the following deficits and impairments:  Decreased endurance, Hypomobility, Increased edema, Decreased scar mobility, Decreased activity tolerance, Pain, Decreased balance, Decreased mobility, Difficulty walking, Improper body mechanics, Decreased range of motion, Impaired flexibility, Postural dysfunction  Visit Diagnosis: 1. Acute pain of right knee   2. Stiffness of right knee, not elsewhere classified   3. Muscle weakness (generalized)   4. Difficulty in walking, not elsewhere classified        Problem List Patient Active  Problem List   Diagnosis Date Noted  . Arthritis of knee 11/11/2018  . Osteoarthritis of left knee 10/18/2014  . Unspecified essential hypertension  09/24/2012  . Obesity, unspecified 09/24/2012    Bess Harvest, PTA 12/02/18 [facility] [address]  [place], [place], [address] Phone: [phone]   Fax:  [phone]  Name: Sandra Shea MRN: [id number] Date of Birth: February 25, 1966

## 2018-12-06 ENCOUNTER — Other Ambulatory Visit: Payer: Self-pay

## 2018-12-06 ENCOUNTER — Ambulatory Visit: Payer: BC Managed Care – PPO

## 2018-12-06 DIAGNOSIS — M25561 Pain in right knee: Secondary | ICD-10-CM | POA: Diagnosis not present

## 2018-12-06 DIAGNOSIS — M6281 Muscle weakness (generalized): Secondary | ICD-10-CM

## 2018-12-06 DIAGNOSIS — M25661 Stiffness of right knee, not elsewhere classified: Secondary | ICD-10-CM

## 2018-12-06 DIAGNOSIS — R262 Difficulty in walking, not elsewhere classified: Secondary | ICD-10-CM

## 2018-12-06 NOTE — Therapy (Signed)
[facility] [address]  [place], [place], [address] Phone: [phone]   Fax:  [phone]  Physical Therapy Treatment  Patient Details  Name: Sandra Shea MRN: [id number] Date of Birth: 01-27-1966 Referring Provider (PT): Meredith Pel, MD   Encounter Date: 12/06/2018  PT End of Session - 12/06/18 1638    Visit Number  3    Number of Visits  13    Date for PT Re-Evaluation  01/11/19    Authorization Type  State BCBS    PT Start Time  1632    PT Stop Time  1738    PT Time Calculation (min)  66 min    Activity Tolerance  Patient tolerated treatment well;Patient limited by pain    Behavior During Therapy  [facility] for tasks assessed/performed       Past Medical History:  Diagnosis Date  . Arthritis   . Hypertension     Past Surgical History:  Procedure Laterality Date  . KNEE SURGERY    . TOTAL KNEE ARTHROPLASTY Right 11/11/2018  . TOTAL KNEE ARTHROPLASTY Right 11/11/2018   Procedure: RIGHT TOTAL KNEE ARTHROPLASTY;  Surgeon: Meredith Pel, MD;  Location: [facility];  Service: Orthopedics;  Laterality: Right;    There were no vitals filed for this visit.  Subjective Assessment - 12/06/18 1635    Subjective  Pt. doing well today.  Felt fine after last session.    Pertinent History  HTN    Patient Stated Goals  get off the walker    Currently in Pain?  Yes    Pain Score  8     Pain Location  Knee    Pain Orientation  Right    Pain Descriptors / Indicators  --   "stiffness"   Pain Type  Acute pain;Surgical pain    Multiple Pain Sites  No         [facility] PT Assessment - 12/06/18 0001      Assessment   Medical Diagnosis  Unilateral primary OA; s/p R TKA    Referring Provider (PT)  Meredith Pel, MD    Onset Date/Surgical Date  11/11/18    Next MD Visit  12/17/18      AROM   AROM Assessment Site  Knee    Right/Left Knee  Right    Right Knee Flexion  73      PROM   PROM Assessment Site   Knee    Right/Left Knee  Right    Right Knee Flexion  83                   [facility] Adult PT Treatment/Exercise - 12/06/18 0001      Ambulation/Gait   Ambulation/Gait  Yes    Ambulation/Gait Assistance  5: Supervision    Ambulation/Gait Assistance Details  Adjusted SPC in L UE to height of wrist     Ambulation Distance (Feet)  90 Feet    Assistive device  None    Gait Pattern  Step-to pattern;Step-through pattern;Decreased stance time - right;Decreased hip/knee flexion - right;Decreased weight shift to right;Decreased step length - left;Poor foot clearance - left;Poor foot clearance - right;Trunk flexed    Ambulation Surface  Level;Indoor    Gait Comments  Pt. required cueing for upright posture R wt. shift and increased R stance time       Knee/Hip Exercises: Stretches   Passive Hamstring Stretch  Right;2 reps;30 seconds  Passive Hamstring Stretch Limitations  strap and manual assistance from therapist     Hip Flexor Stretch  Right;2 reps;30 seconds    Hip Flexor Stretch Limitations  strap     Gastroc Stretch  Right;1 rep;30 seconds    Gastroc Stretch Limitations  assistance from therapist with strap       Knee/Hip Exercises: Aerobic   Nustep  Lvl 4, 6 min (UE/LE)      Knee/Hip Exercises: Standing   Heel Raises  Both;15 reps    Heel Raises Limitations  in RW    Knee Flexion  Right;Left;10 reps;Strengthening    Knee Flexion Limitations  Alternating hamstring curl at counter     Functional Squat  15 reps;3 seconds    Functional Squat Limitations  counter       Knee/Hip Exercises: Supine   Straight Leg Raises  Right;10 reps;Strengthening      Vasopneumatic   Number Minutes Vasopneumatic   10 minutes    Vasopnuematic Location   Knee    Vasopneumatic Pressure  Medium    Vasopneumatic Temperature   coldest temp.       Manual Therapy   Manual Therapy  Joint mobilization    Manual therapy comments  supine     Joint Mobilization  R patellar mobs all directions - min  restricted              PT Education - 12/06/18 1742    Education Details  HEP update    Person(s) Educated  Patient    Methods  Explanation;Demonstration;Verbal cues;Handout    Comprehension  Verbalized understanding;Returned demonstration;Verbal cues required       PT Short Term Goals - 12/02/18 1721      PT SHORT TERM GOAL #1   Title  Patient to be independent with initial HEP.    Time  3    Period  Weeks    Status  On-going    Target Date  12/21/18        PT Long Term Goals - 12/02/18 1721      PT LONG TERM GOAL #1   Title  Patient to be independent with advanced HEP.    Time  6    Period  Weeks    Status  On-going      PT LONG TERM GOAL #2   Title  Patient to demonstrate R knee AROM 0-120 degrees.    Time  6    Period  Weeks    Status  On-going      PT LONG TERM GOAL #3   Title  Patient to demonstrate B LE strength >=4+/5.    Time  6    Period  Weeks    Status  On-going      PT LONG TERM GOAL #4   Title  Patient to demonstrate R SLR without quad lag.    Time  6    Period  Weeks    Status  On-going      PT LONG TERM GOAL #5   Title  Patient to demonstrate symmetrical step length, knee flexion, and weight shift with ambulation with LRAD.    Time  6    Period  Weeks    Status  On-going            Plan - 12/06/18 1639    Clinical Impression Statement  Sandra Shea doing well today.  Seen to start session ambulating into clinic with [facility] which was at excessive height.  Adjusted cane  height and provided cueing for increased R stance time and R wt. shift.  Pt. demonstrating much improved gait mechanics after cueing with no sign of R quad "buckling" however mild quad instability noted.  Pt. able to demo improved R knee flexion PROM to 83 dg today with therapist overpressure.  Strengthening therex focused on improving quad/hamstring and hip flexor activation as pt. demonstrating poor LE clearance to gait.  Pt. HEP updated.  Will monitor tolerance to HEP update  in coming session.  Ice/compression applied to R knee to end session to reduce post-exercise swelling and pain.    Personal Factors and Comorbidities  Age;Fitness;Past/Current Experience;Profession;Time since onset of injury/illness/exacerbation;Comorbidity 1    Rehab Potential  Good    PT Treatment/Interventions  ADLs/Self Care Home Management;Cryotherapy;Electrical Stimulation;Moist Heat;Balance training;Therapeutic exercise;Therapeutic activities;Functional mobility training;Stair training;Gait training;Ultrasound;Neuromuscular re-education;Patient/family education;Manual techniques;Vasopneumatic Device;Taping;Splinting;Energy conservation;Dry needling;Passive range of motion;Scar mobilization    PT Next Visit Plan  Monitor technique/tolerance to updated HEP; R knee ROM; edema management; vasoneumatic compression    Consulted and Agree with Plan of Care  Patient       Patient will benefit from skilled therapeutic intervention in order to improve the following deficits and impairments:  Decreased endurance, Hypomobility, Increased edema, Decreased scar mobility, Decreased activity tolerance, Pain, Decreased balance, Decreased mobility, Difficulty walking, Improper body mechanics, Decreased range of motion, Impaired flexibility, Postural dysfunction  Visit Diagnosis: 1. Acute pain of right knee   2. Stiffness of right knee, not elsewhere classified   3. Muscle weakness (generalized)   4. Difficulty in walking, not elsewhere classified        Problem List Patient Active Problem List   Diagnosis Date Noted  . Arthritis of knee 11/11/2018  . Osteoarthritis of left knee 10/18/2014  . Unspecified essential hypertension 09/24/2012  . Obesity, unspecified 09/24/2012    Bess Harvest, PTA 12/06/18 5:51 PM    [facility] [address]  [place], [place], [address] Phone: [phone]   Fax:  [phone]  Name: Sandra Shea MRN: [id number] Date of Birth: 1965/11/10

## 2018-12-08 ENCOUNTER — Other Ambulatory Visit: Payer: Self-pay

## 2018-12-08 ENCOUNTER — Ambulatory Visit: Payer: BC Managed Care – PPO

## 2018-12-08 DIAGNOSIS — M25561 Pain in right knee: Secondary | ICD-10-CM

## 2018-12-08 DIAGNOSIS — R262 Difficulty in walking, not elsewhere classified: Secondary | ICD-10-CM

## 2018-12-08 DIAGNOSIS — M25661 Stiffness of right knee, not elsewhere classified: Secondary | ICD-10-CM

## 2018-12-08 DIAGNOSIS — M6281 Muscle weakness (generalized): Secondary | ICD-10-CM

## 2018-12-08 NOTE — Therapy (Deleted)
[facility] [address]  [place], [place], [address] Phone: [phone]   Fax:  [phone]  Physical Therapy Treatment  Patient Details  Name: Sandra Shea MRN: [id number] Date of Birth: 1965/09/19 Referring Provider (PT): Meredith Pel, MD   Encounter Date: 12/08/2018  PT End of Session - 12/08/18 1704    Visit Number  4    Number of Visits  13    Date for PT Re-Evaluation  01/11/19    Authorization Type  State BCBS    PT Start Time  1656    PT Stop Time  1808    PT Time Calculation (min)  72 min    Activity Tolerance  Patient tolerated treatment well;Patient limited by pain    Behavior During Therapy  [facility] for tasks assessed/performed       Past Medical History:  Diagnosis Date  . Arthritis   . Hypertension     Past Surgical History:  Procedure Laterality Date  . KNEE SURGERY    . TOTAL KNEE ARTHROPLASTY Right 11/11/2018  . TOTAL KNEE ARTHROPLASTY Right 11/11/2018   Procedure: RIGHT TOTAL KNEE ARTHROPLASTY;  Surgeon: Meredith Pel, MD;  Location: [facility];  Service: Orthopedics;  Laterality: Right;    There were no vitals filed for this visit.  Subjective Assessment - 12/08/18 1702    Subjective  Pt. primary complaint today is R knee "stiffness".    Pertinent History  HTN    Patient Stated Goals  get off the walker    Currently in Pain?  Yes    Pain Score  8     Pain Location  Knee    Pain Orientation  Right    Pain Descriptors / Indicators  --   "stiffness"   Pain Type  Acute pain;Surgical pain    Aggravating Factors   end range knee flexion    Multiple Pain Sites  No                                 PT Short Term Goals - 12/02/18 1721      PT SHORT TERM GOAL #1   Title  Patient to be independent with initial HEP.    Time  3    Period  Weeks    Status  On-going    Target Date  12/21/18        PT Long Term Goals - 12/02/18 1721      PT LONG TERM  GOAL #1   Title  Patient to be independent with advanced HEP.    Time  6    Period  Weeks    Status  On-going      PT LONG TERM GOAL #2   Title  Patient to demonstrate R knee AROM 0-120 degrees.    Time  6    Period  Weeks    Status  On-going      PT LONG TERM GOAL #3   Title  Patient to demonstrate B LE strength >=4+/5.    Time  6    Period  Weeks    Status  On-going      PT LONG TERM GOAL #4   Title  Patient to demonstrate R SLR without quad lag.    Time  6    Period  Weeks    Status  On-going      PT LONG  TERM GOAL #5   Title  Patient to demonstrate symmetrical step length, knee flexion, and weight shift with ambulation with LRAD.    Time  6    Period  Weeks    Status  On-going            Plan - 12/08/18 1808    Clinical Impression Statement  Pt. reporting increased R knee stiffness today however denies pain.  Ambulating into clinic with RW and brought [facility] with her which she reports she is now using most of the time at home.  Required some cueing with SPC ambulation for improved upright posture and minor cueing for sequencing x 1 today however good overall carryover for remainder of session.  Session focused on isolated R quad strengthening focusing on TKE as pt. still with difficulty performing TKE with SLR with notable quad lag.  Pt. appearing stable with SPC ambulation and denies "buckling" at R knee however did encourage pt. to focus on TKE with SLR, LAQ, and quad set as previously issued to pt. for HEP.  Ended session with ice/compression to R knee to reduce post-exercise swelling and pain and instructed pt. in scar massage with lotion to superior portion of incision which is now well healed.  Pt. verbalized understanding.    Personal Factors and Comorbidities  Age;Fitness;Past/Current Experience;Profession;Time since onset of injury/illness/exacerbation;Comorbidity 1    Examination-Activity Limitations  Locomotion  Level;Lift;Hygiene/Grooming;Transfers;Dressing;Toileting;Carry;Stand;Caring for Others;Stairs;Squat;Bend;Sleep;Sit;Bed Mobility;Bathing    Rehab Potential  Good    PT Treatment/Interventions  ADLs/Self Care Home Management;Cryotherapy;Electrical Stimulation;Moist Heat;Balance training;Therapeutic exercise;Therapeutic activities;Functional mobility training;Stair training;Gait training;Ultrasound;Neuromuscular re-education;Patient/family education;Manual techniques;Vasopneumatic Device;Taping;Splinting;Energy conservation;Dry needling;Passive range of motion;Scar mobilization    PT Next Visit Plan  Monitor technique/tolerance to updated HEP; R knee ROM; edema management; vasoneumatic compression    Consulted and Agree with Plan of Care  Patient       Patient will benefit from skilled therapeutic intervention in order to improve the following deficits and impairments:  Decreased endurance, Hypomobility, Increased edema, Decreased scar mobility, Decreased activity tolerance, Pain, Decreased balance, Decreased mobility, Difficulty walking, Improper body mechanics, Decreased range of motion, Impaired flexibility, Postural dysfunction  Visit Diagnosis: 1. Acute pain of right knee   2. Stiffness of right knee, not elsewhere classified   3. Muscle weakness (generalized)   4. Difficulty in walking, not elsewhere classified        Problem List Patient Active Problem List   Diagnosis Date Noted  . Arthritis of knee 11/11/2018  . Osteoarthritis of left knee 10/18/2014  . Unspecified essential hypertension 09/24/2012  . Obesity, unspecified 09/24/2012    Bess Harvest, PTA 12/09/18 9:30 AM    [facility] [address]  [place], [place], [address] Phone: [phone]   Fax:  [phone]  Name: Sandra Shea MRN: [id number] Date of Birth: May 09, 1965

## 2018-12-09 ENCOUNTER — Telehealth: Payer: Self-pay | Admitting: Orthopedic Surgery

## 2018-12-09 MED ORDER — METHOCARBAMOL 500 MG PO TABS: 500.0000 mg | ORAL_TABLET | Freq: Three times a day (TID) | ORAL | 0 refills | Status: DC | PRN

## 2018-12-09 NOTE — Therapy (Signed)
[facility] [address]  [place], [place], [address] Phone: [phone]   Fax:  [phone]  Physical Therapy Treatment All interventions performed day of session.  Patient Details  Name: Sandra Shea MRN: [id number] Date of Birth: 02-Oct-1965 Referring Provider (PT): Meredith Pel, MD   Encounter Date: 12/08/2018  PT End of Session - 12/08/18 1704    Visit Number  4    Number of Visits  13    Date for PT Re-Evaluation  01/11/19    Authorization Type  State BCBS    PT Start Time  1656    PT Stop Time  1808    PT Time Calculation (min)  72 min    Activity Tolerance  Patient tolerated treatment well;Patient limited by pain    Behavior During Therapy  [facility] for tasks assessed/performed       Past Medical History:  Diagnosis Date  . Arthritis   . Hypertension     Past Surgical History:  Procedure Laterality Date  . KNEE SURGERY    . TOTAL KNEE ARTHROPLASTY Right 11/11/2018  . TOTAL KNEE ARTHROPLASTY Right 11/11/2018   Procedure: RIGHT TOTAL KNEE ARTHROPLASTY;  Surgeon: Meredith Pel, MD;  Location: [facility];  Service: Orthopedics;  Laterality: Right;    There were no vitals filed for this visit.  Subjective Assessment - 12/08/18 1702    Subjective  Pt. primary complaint today is R knee "stiffness".    Pertinent History  HTN    Patient Stated Goals  get off the walker    Currently in Pain?  Yes    Pain Score  8     Pain Location  Knee    Pain Orientation  Right    Pain Descriptors / Indicators  --   "stiffness"   Pain Type  Acute pain;Surgical pain    Aggravating Factors   end range knee flexion    Multiple Pain Sites  No                       [facility] Adult PT Treatment/Exercise - 12/08/18 0001      Self-Care   Self-Care  Scar Mobilizations    Scar Mobilizations  Instructed pt. in scar massage with demo with lotion to superior portion of incision well healed       Knee/Hip  Exercises: Stretches   Passive Hamstring Stretch  Right;2 reps;30 seconds    Passive Hamstring Stretch Limitations  strap and manual assistance from therapist     Hip Flexor Stretch  Right;2 reps;30 seconds    Hip Flexor Stretch Limitations  strap; mod thomas       Knee/Hip Exercises: Aerobic   Nustep  Lvl 4, 8 min (UE/LE)      Knee/Hip Exercises: Standing   Terminal Knee Extension  Right;15 reps;Strengthening;Theraband    Theraband Level (Terminal Knee Extension)  Level 3 (Green)      Knee/Hip Exercises: Seated   Long Arc Quad  Right;10 reps;Strengthening    Long Arc Quad Weight  1 lbs.    Long Arc [employer] Limitations  Cues for TKE    Hamstring Curl  Right;10 reps    Hamstring Limitations  green TB    Sit to [employer]  10 reps;with UE support      Knee/Hip Exercises: Supine   Short Arc Quad Sets  Right;10 reps;Strengthening;2 sets   1#    Straight Leg Raises  Right;10 reps;Strengthening  Vasopneumatic   Number Minutes Vasopneumatic   10 minutes    Vasopnuematic Location   Knee    Vasopneumatic Pressure  Medium    Vasopneumatic Temperature   coldest temp.       Manual Therapy   Manual Therapy  Joint mobilization;Soft tissue mobilization;Taping    Manual therapy comments  supine     Joint Mobilization  R patellar mobs all directions - min restricted     Soft tissue mobilization  STM to distal quad     Kinesiotex  Edema      Kinesiotix   Edema  R lateral knee edema taping pattenr                PT Short Term Goals - 12/02/18 1721      PT SHORT TERM GOAL #1   Title  Patient to be independent with initial HEP.    Time  3    Period  Weeks    Status  On-going    Target Date  12/21/18        PT Long Term Goals - 12/02/18 1721      PT LONG TERM GOAL #1   Title  Patient to be independent with advanced HEP.    Time  6    Period  Weeks    Status  On-going      PT LONG TERM GOAL #2   Title  Patient to demonstrate R knee AROM 0-120 degrees.    Time  6     Period  Weeks    Status  On-going      PT LONG TERM GOAL #3   Title  Patient to demonstrate B LE strength >=4+/5.    Time  6    Period  Weeks    Status  On-going      PT LONG TERM GOAL #4   Title  Patient to demonstrate R SLR without quad lag.    Time  6    Period  Weeks    Status  On-going      PT LONG TERM GOAL #5   Title  Patient to demonstrate symmetrical step length, knee flexion, and weight shift with ambulation with LRAD.    Time  6    Period  Weeks    Status  On-going            Plan - 12/08/18 1808    Clinical Impression Statement  Pt. reporting increased R knee stiffness today however denies pain.  Ambulating into clinic with RW and brought [facility] with her which she reports she is now using most of the time at home.  Required some cueing with SPC ambulation for improved upright posture and minor cueing for sequencing x 1 today however good overall carryover for remainder of session.  Session focused on isolated R quad strengthening focusing on TKE as pt. still with difficulty performing TKE with SLR with notable quad lag.  Pt. appearing stable with SPC ambulation and denies "buckling" at R knee however did encourage pt. to focus on TKE with SLR, LAQ, and quad set as previously issued to pt. for HEP.  Ended session with ice/compression to R knee to reduce post-exercise swelling and pain and instructed pt. in scar massage with lotion to superior portion of incision which is now well healed.  Pt. verbalized understanding.    Personal Factors and Comorbidities  Age;Fitness;Past/Current Experience;Profession;Time since onset of injury/illness/exacerbation;Comorbidity 1    Examination-Activity Limitations  Locomotion Level;Lift;Hygiene/Grooming;Transfers;Dressing;Toileting;Carry;Stand;Caring for Others;Stairs;Squat;Bend;Sleep;Sit;Bed Mobility;Bathing  Rehab Potential  Good    PT Treatment/Interventions  ADLs/Self Care Home Management;Cryotherapy;Electrical Stimulation;Moist  Heat;Balance training;Therapeutic exercise;Therapeutic activities;Functional mobility training;Stair training;Gait training;Ultrasound;Neuromuscular re-education;Patient/family education;Manual techniques;Vasopneumatic Device;Taping;Splinting;Energy conservation;Dry needling;Passive range of motion;Scar mobilization    PT Next Visit Plan  Monitor technique/tolerance to updated HEP; R knee ROM; edema management; vasoneumatic compression    Consulted and Agree with Plan of Care  Patient       Patient will benefit from skilled therapeutic intervention in order to improve the following deficits and impairments:  Decreased endurance, Hypomobility, Increased edema, Decreased scar mobility, Decreased activity tolerance, Pain, Decreased balance, Decreased mobility, Difficulty walking, Improper body mechanics, Decreased range of motion, Impaired flexibility, Postural dysfunction  Visit Diagnosis: 1. Acute pain of right knee   2. Stiffness of right knee, not elsewhere classified   3. Muscle weakness (generalized)   4. Difficulty in walking, not elsewhere classified        Problem List Patient Active Problem List   Diagnosis Date Noted  . Arthritis of knee 11/11/2018  . Osteoarthritis of left knee 10/18/2014  . Unspecified essential hypertension 09/24/2012  . Obesity, unspecified 09/24/2012    Bess Harvest, PTA 12/08/18 6:05  PM   [facility] [address]  [place], [place], [address] Phone: [phone]   Fax:  [phone]  Name: Sandra Shea MRN: [id number] Date of Birth: Dec 05, 1965

## 2018-12-09 NOTE — Telephone Encounter (Signed)
Pt called in requesting a prescription for a muscle relaxer.  Please send that over to [facility] in [place] on [address].   [phone]

## 2018-12-09 NOTE — Telephone Encounter (Signed)
Submitted Pt advised

## 2018-12-09 NOTE — Addendum Note (Signed)
Addended byLaurann [place] on: 12/09/2018 03:26 PM   Modules accepted: Orders

## 2018-12-09 NOTE — Telephone Encounter (Signed)
Ok for robaxin 500 po a q 8 # 30

## 2018-12-09 NOTE — Telephone Encounter (Signed)
Please advise. Thanks.  

## 2018-12-13 ENCOUNTER — Other Ambulatory Visit: Payer: Self-pay

## 2018-12-13 ENCOUNTER — Ambulatory Visit: Payer: BC Managed Care – PPO | Admitting: Physical Therapy

## 2018-12-13 ENCOUNTER — Encounter: Payer: Self-pay | Admitting: Physical Therapy

## 2018-12-13 DIAGNOSIS — M25561 Pain in right knee: Secondary | ICD-10-CM | POA: Diagnosis not present

## 2018-12-13 DIAGNOSIS — M6281 Muscle weakness (generalized): Secondary | ICD-10-CM

## 2018-12-13 DIAGNOSIS — R262 Difficulty in walking, not elsewhere classified: Secondary | ICD-10-CM

## 2018-12-13 DIAGNOSIS — M25661 Stiffness of right knee, not elsewhere classified: Secondary | ICD-10-CM

## 2018-12-13 NOTE — Therapy (Signed)
[name] [address]  [place], [place], [address] Phone: [phone]   Fax:  [phone]  Physical Therapy Treatment  Patient Details  Name: Sandra Shea MRN: [id number] Date of Birth: [birth year] Referring Provider (PT): Sandra Pel, MD   Encounter Date: 12/13/2018  PT End of Session - 12/13/18 1744    Visit Number  5    Number of Visits  13    Date for PT Re-Evaluation  01/11/19    Authorization Type  State BCBS    PT Start Time  1700    PT Stop Time  1753    PT Time Calculation (min)  53 min    Activity Tolerance  Patient tolerated treatment well;Patient limited by pain    Behavior During Therapy  Sandra Shea for tasks assessed/performed       Past Medical History:  Diagnosis Date  . Arthritis   . Hypertension     Past Surgical History:  Procedure Laterality Date  . KNEE SURGERY    . TOTAL KNEE ARTHROPLASTY Right 11/11/2018  . TOTAL KNEE ARTHROPLASTY Right 11/11/2018   Procedure: RIGHT TOTAL KNEE ARTHROPLASTY;  Surgeon: Sandra Pel, MD;  Location: [facility];  Service: Orthopedics;  Laterality: Right;    There were no vitals filed for this visit.  Subjective Assessment - 12/13/18 1702    Subjective  Reports that she sat with her knee down the whole day and is now a bit swollen.    Pertinent History  HTN    Patient Stated Goals  get off the walker    Currently in Pain?  Yes    Pain Score  7     Pain Location  Knee    Pain Orientation  Right    Pain Type  Acute pain;Surgical pain                       [facility] Adult PT Treatment/Exercise - 12/13/18 0001      Exercises   Exercises  Knee/Hip      Knee/Hip Exercises: Stretches   Passive Hamstring Stretch  Left;1 rep;20 seconds    Passive Hamstring Stretch Limitations  to alleviate cramping      Knee/Hip Exercises: Aerobic   Recumbent Bike  Lvl 1, 6 min - partial revolutions for ROM       Knee/Hip Exercises: Standing   Hip  Abduction  Stengthening;Right;1 set;10 reps;Knee straight    Abduction Limitations  at counter; 1#      Knee/Hip Exercises: Seated   Long Arc Quad  Right;10 reps;Strengthening;2 sets    Long Arc Quad Weight  2 lbs.    Long Arc Quad Limitations  good tolerance    Hamstring Curl  Right;15 reps;Strengthening    Hamstring Limitations  green TB      Knee/Hip Exercises: Supine   Heel Slides  AAROM;Right;1 set;10 reps    Heel Slides Limitations  10x5" to tolerance with strap and pball    Other Supine Knee/Hip Exercises  clam with red TB above knees x15      Knee/Hip Exercises: Sidelying   Hip ABduction  Strengthening;Right;2 sets;5 reps    Hip ABduction Limitations  PT assisting to prevent rolling backwards      Vasopneumatic   Number Minutes Vasopneumatic   10 minutes    Vasopnuematic Location   Knee   R   Vasopneumatic Pressure  Medium    Vasopneumatic Temperature   coldest temp.  Manual Therapy   Manual Therapy  Joint mobilization;Soft tissue mobilization;Taping    Manual therapy comments  supine     Joint Mobilization  R patellar mobs all directions- WNL mobility; R knee posterior tibial mobs for knee flexion grade III to tolerance     Soft tissue mobilization  R knee scar massage to proximal healed portions               PT Short Term Goals - 12/02/18 1721      PT SHORT TERM GOAL #1   Title  Patient to be independent with initial HEP.    Time  3    Period  Weeks    Status  On-going    Target Date  12/21/18        PT Long Term Goals - 12/02/18 1721      PT LONG TERM GOAL #1   Title  Patient to be independent with advanced HEP.    Time  6    Period  Weeks    Status  On-going      PT LONG TERM GOAL #2   Title  Patient to demonstrate R knee AROM 0-120 degrees.    Time  6    Period  Weeks    Status  On-going      PT LONG TERM GOAL #3   Title  Patient to demonstrate B LE strength >=4+/5.    Time  6    Period  Weeks    Status  On-going      PT  LONG TERM GOAL #4   Title  Patient to demonstrate R SLR without quad lag.    Time  6    Period  Weeks    Status  On-going      PT LONG TERM GOAL #5   Title  Patient to demonstrate symmetrical step length, knee flexion, and weight shift with ambulation with LRAD.    Time  6    Period  Weeks    Status  On-going            Plan - 12/13/18 1746    Clinical Impression Statement  Patient reporting improvement in R knee edema with use of KT tape last session. Tape still on, thus it was removed and no irritation observed. Patient tolerated joint mobilization to R patella and proximal tibia for improvement in knee flexion. Worked on sidelying abduction and banded hip ER for improvement in hip strength necessary to lift R LE in order to roll in bed. Patient still reporting significant difficulty completing this task. Thus, reviewed HEP and encouraged patient to continue performing standing hip abduction to progress towards sidelying abduction unassisted. Able to tolerate addition of increased weighted resistance with this exercise as well as LAQ. Ended session with Gameready for edema. No complaints at end of session.    Rehab Potential  Good    PT Treatment/Interventions  ADLs/Self Care Home Management;Cryotherapy;Electrical Stimulation;Moist Heat;Balance training;Therapeutic exercise;Therapeutic activities;Functional mobility training;Stair training;Gait training;Ultrasound;Neuromuscular re-education;Patient/family education;Manual techniques;Vasopneumatic Device;Taping;Splinting;Energy conservation;Dry needling;Passive range of motion;Scar mobilization    PT Next Visit Plan  Monitor technique/tolerance to updated HEP; R knee ROM; edema management; vasoneumatic compression    Consulted and Agree with Plan of Care  Patient       Patient will benefit from skilled therapeutic intervention in order to improve the following deficits and impairments:  Decreased endurance, Hypomobility, Increased edema,  Decreased scar mobility, Decreased activity tolerance, Pain, Decreased balance, Decreased mobility, Difficulty walking, Improper body mechanics,  Decreased range of motion, Impaired flexibility, Postural dysfunction  Visit Diagnosis: 1. Acute pain of right knee   2. Stiffness of right knee, not elsewhere classified   3. Muscle weakness (generalized)   4. Difficulty in walking, not elsewhere classified        Problem List Patient Active Problem List   Diagnosis Date Noted  . Arthritis of knee 11/11/2018  . Osteoarthritis of left knee 10/18/2014  . Unspecified essential hypertension 09/24/2012  . Obesity, unspecified 09/24/2012     Janene Harvey, PT, DPT 12/13/18 6:05 PM   [facility] [address]  [place], [place], [address] Phone: [phone]   Fax:  [phone]  Name: Sandra Shea MRN: [id number] Date of Birth: 10/23/1965

## 2018-12-15 ENCOUNTER — Other Ambulatory Visit: Payer: Self-pay

## 2018-12-15 ENCOUNTER — Encounter: Payer: Self-pay | Admitting: Physical Therapy

## 2018-12-15 ENCOUNTER — Ambulatory Visit: Payer: BC Managed Care – PPO | Admitting: Physical Therapy

## 2018-12-15 DIAGNOSIS — M6281 Muscle weakness (generalized): Secondary | ICD-10-CM

## 2018-12-15 DIAGNOSIS — R262 Difficulty in walking, not elsewhere classified: Secondary | ICD-10-CM

## 2018-12-15 DIAGNOSIS — M25561 Pain in right knee: Secondary | ICD-10-CM

## 2018-12-15 DIAGNOSIS — M25661 Stiffness of right knee, not elsewhere classified: Secondary | ICD-10-CM

## 2018-12-15 NOTE — Therapy (Signed)
[facility] [address]  [place], [place], [address] Phone: [phone]   Fax:  [phone]  Physical Therapy Treatment  Patient Details  Name: Sandra Shea MRN: [id number] Date of Birth: [birth year] Referring Provider (PT): Meredith Pel, MD   Encounter Date: 12/15/2018  PT End of Session - 12/15/18 1740    Visit Number  6    Number of Visits  13    Date for PT Re-Evaluation  01/11/19    Authorization Type  State BCBS    PT Start Time  2778    PT Stop Time  1752    PT Time Calculation (min)  67 min    Activity Tolerance  Patient tolerated treatment well;Patient limited by pain    Behavior During Therapy  [facility] for tasks assessed/performed       Past Medical History:  Diagnosis Date  . Arthritis   . Hypertension     Past Surgical History:  Procedure Laterality Date  . KNEE SURGERY    . TOTAL KNEE ARTHROPLASTY Right 11/11/2018  . TOTAL KNEE ARTHROPLASTY Right 11/11/2018   Procedure: RIGHT TOTAL KNEE ARTHROPLASTY;  Surgeon: Meredith Pel, MD;  Location: [facility];  Service: Orthopedics;  Laterality: Right;    There were no vitals filed for this visit.  Subjective Assessment - 12/15/18 1648    Subjective  Reports that her knee has been swollen and painful today. Has been on her feet all day. Reports 50% improvement in R knee- notes that she is now able to bend her knee further. Would like to continue working on ROM.    Pertinent History  HTN    Patient Stated Goals  get off the walker    Currently in Pain?  Yes    Pain Score  8     Pain Location  Knee    Pain Orientation  Right    Pain Descriptors / Indicators  Dull    Pain Type  Acute pain;Surgical pain         [facility] PT Assessment - 12/15/18 0001      AROM   Right/Left Knee  Right    Right Knee Extension  0    Right Knee Flexion  85      PROM   Right/Left Knee  Right    Right Knee Extension  -3    Right Knee Flexion  87      Strength    Right Hip Flexion  3+/5    Right Hip ABduction  4/5    Right Hip ADduction  4+/5    Left Hip Flexion  5/5    Left Hip ABduction  4/5    Left Hip ADduction  4+/5    Right Knee Flexion  4/5    Right Knee Extension  4/5    Left Knee Flexion  4+/5    Left Knee Extension  4+/5    Right Ankle Dorsiflexion  4+/5    Right Ankle Plantar Flexion  4+/5    Left Ankle Dorsiflexion  4+/5    Left Ankle Plantar Flexion  4+/5                   [facility] Adult PT Treatment/Exercise - 12/15/18 0001      Knee/Hip Exercises: Stretches   Passive Hamstring Stretch  Left;1 rep;20 seconds    Passive Hamstring Stretch Limitations  to alleviate cramping    Hip Flexor Stretch  Right;2 reps;30 seconds  Hip Flexor Stretch Limitations  strap; mod thomas    pain om hip flexor on return     Knee/Hip Exercises: Aerobic   Recumbent Bike  Lvl 1, 6 min - partial revolutions for ROM       Knee/Hip Exercises: Seated   Long Arc Quad  Right;10 reps;Strengthening;2 sets    [employer] Weight  2 lbs.    Long [employer] Limitations  cues for TKE    Hamstring Curl  Right;15 reps;Strengthening    Hamstring Limitations  green TB      Knee/Hip Exercises: Supine   Heel Slides  AAROM;Right;1 set;10 reps    Heel Slides Limitations  10x5" to tolerance with strap and pball    Bridges  Strengthening;Both;1 set;10 reps    Bridges Limitations  discontinued d/t L HS cramping    Straight Leg Raises  Right;10 reps;Strengthening    Straight Leg Raises Limitations  quad lag      Knee/Hip Exercises: Sidelying   Hip ABduction  Strengthening;Right;2 sets;5 reps    Hip ABduction Limitations  PT assisting to prevent rolling backwards   cues for LE alignment     Vasopneumatic   Number Minutes Vasopneumatic   10 minutes    Vasopnuematic Location   Knee   R   Vasopneumatic Pressure  Medium    Vasopneumatic Temperature   coldest temp.       [employer]   Edema  R lateral knee edema taping pattern              PT Education - 12/15/18 1754    Education Details  discussion on objective progress with PT; discussion on use of modalities for edema; edu on KT tape precautions and wear time    Person(s) Educated  Patient    Methods  Explanation    Comprehension  Verbalized understanding       PT Short Term Goals - 12/15/18 1651      PT SHORT TERM GOAL #1   Title  Patient to be independent with initial HEP.    Time  3    Period  Weeks    Status  Achieved    Target Date  12/21/18        PT Long Term Goals - 12/15/18 1653      PT LONG TERM GOAL #1   Title  Patient to be independent with advanced HEP.    Time  6    Period  Weeks    Status  Partially Met   met for current     PT LONG TERM GOAL #2   Title  Patient to demonstrate R knee AROM 0-120 degrees.    Time  6    Period  Weeks    Status  Partially Met   AROM 0-85 degrees, PROM -3-87 degrees     PT LONG TERM GOAL #3   Title  Patient to demonstrate B LE strength >=4+/5.    Time  6    Period  Weeks    Status  Partially Met   demonstrating improvements in R hip flexion, B hip abduction, R knee flexion/extension, and B ankle plantarflexion strength     PT LONG TERM GOAL #4   Title  Patient to demonstrate R SLR without quad lag.    Time  6    Period  Weeks    Status  On-going   still evident quad lag     PT LONG TERM GOAL #5   Title  Patient to demonstrate symmetrical step length, knee flexion, and weight shift with ambulation with LRAD.    Time  6    Period  Weeks    Status  On-going   still demonstrating R/L lateral trunk lean and antalgia on R LE with [place] - 12/15/18 1752    Clinical Impression Statement  Patient arrived to session with report of increased R knee pain after cleaning the house and being on her feet all day. R lateral knee slightly edematous at start of session. Patient reports 50% improvement in R knee since starting PT. Strength testing revealed improvements in R hip  flexion, B hip abduction, R knee flexion/extension, and B ankle plantarflexion strength. Patient still with evident quad lag with SLR. Able to reach R knee AROM 0-85 degrees, PROM -3-87 degrees after LE stretching. Patient still demonstrating R/L lateral trunk lean and antalgia on R LE with SPC during ambulation. Now relying less on RW. Tolerated LE strengthening with intermittent cues for form. Patient reporting R hip flexor pain with SLR today, but tolerable. Ended session with KT tape to R lateral knee and Gameready for edema. No complaints at end of session. Patient progressing towards goals. Would benefit from continued skilled PT services to address remaining goals.    Comorbidities  HTN    Rehab Potential  Good    PT Treatment/Interventions  ADLs/Self Care Home Management;Cryotherapy;Electrical Stimulation;Moist Heat;Balance training;Therapeutic exercise;Therapeutic activities;Functional mobility training;Stair training;Gait training;Ultrasound;Neuromuscular re-education;Patient/family education;Manual techniques;Vasopneumatic Device;Taping;Splinting;Energy conservation;Dry needling;Passive range of motion;Scar mobilization    PT Next Visit Plan  Monitor technique/tolerance to updated HEP; R knee ROM; edema management; vasoneumatic compression    Consulted and Agree with Plan of Care  Patient       Patient will benefit from skilled therapeutic intervention in order to improve the following deficits and impairments:  Decreased endurance, Hypomobility, Increased edema, Decreased scar mobility, Decreased activity tolerance, Pain, Decreased balance, Decreased mobility, Difficulty walking, Improper body mechanics, Decreased range of motion, Impaired flexibility, Postural dysfunction  Visit Diagnosis: 1. Acute pain of right knee   2. Stiffness of right knee, not elsewhere classified   3. Muscle weakness (generalized)   4. Difficulty in walking, not elsewhere classified        Problem  List Patient Active Problem List   Diagnosis Date Noted  . Arthritis of knee 11/11/2018  . Osteoarthritis of left knee 10/18/2014  . Unspecified essential hypertension 09/24/2012  . Obesity, unspecified 09/24/2012    Janene Harvey, PT, DPT 12/15/18 5:57 PM   [facility] [address]  [place], [place], [address] Phone: [phone]   Fax:  [phone]  Name: Sandra Shea MRN: [id number] Date of Birth: 10/03/1965

## 2018-12-17 ENCOUNTER — Ambulatory Visit (INDEPENDENT_AMBULATORY_CARE_PROVIDER_SITE_OTHER): Payer: BC Managed Care – PPO | Admitting: Orthopedic Surgery

## 2018-12-17 ENCOUNTER — Encounter: Payer: Self-pay | Admitting: Orthopedic Surgery

## 2018-12-17 DIAGNOSIS — M1711 Unilateral primary osteoarthritis, right knee: Secondary | ICD-10-CM

## 2018-12-17 NOTE — Progress Notes (Signed)
   Post-Op Visit Note   Patient: Sandra Shea           Date of Birth: 1965-05-15           MRN: [id number] Visit Date: 12/17/2018 PCP: Kennyth Arnold, FNP   Assessment & Plan:  Chief Complaint:  Chief Complaint  Patient presents with  . Right Knee - Follow-up   Visit Diagnoses: No diagnosis found.  Plan: Jaisa is now about 5 weeks out right total knee replacement.  In physical therapy.  On exam she has flexion 85 and full extension.  Taking hydrocodone for pain.  No calf tenderness today.  Plan is to continue with therapy to work on getting about 110 of flexion.  Come back in 4 weeks for clinical recheck.  Anticipate her being out of work for full 3 months after surgery  Follow-Up Instructions: Return in about 4 weeks (around 01/14/2019).   Orders:  No orders of the defined types were placed in this encounter.  No orders of the defined types were placed in this encounter.   Imaging: No results found.  PMFS History: Patient Active Problem List   Diagnosis Date Noted  . Arthritis of knee 11/11/2018  . Osteoarthritis of left knee 10/18/2014  . Unspecified essential hypertension 09/24/2012  . Obesity, unspecified 09/24/2012   Past Medical History:  Diagnosis Date  . Arthritis   . Hypertension     History reviewed. No pertinent family history.  Past Surgical History:  Procedure Laterality Date  . KNEE SURGERY    . TOTAL KNEE ARTHROPLASTY Right 11/11/2018  . TOTAL KNEE ARTHROPLASTY Right 11/11/2018   Procedure: RIGHT TOTAL KNEE ARTHROPLASTY;  Surgeon: Meredith Pel, MD;  Location: [facility];  Service: Orthopedics;  Laterality: Right;   Social History   Occupational History  . Not on file  Tobacco Use  . Smoking status: Never Smoker  . Smokeless tobacco: Never Used  Substance and Sexual Activity  . Alcohol use: Yes    Comment: socially  . Drug use: No  . Sexual activity: Not on file

## 2018-12-20 ENCOUNTER — Ambulatory Visit: Payer: BC Managed Care – PPO | Admitting: Physical Therapy

## 2018-12-20 ENCOUNTER — Encounter: Payer: Self-pay | Admitting: Physical Therapy

## 2018-12-20 ENCOUNTER — Other Ambulatory Visit: Payer: Self-pay

## 2018-12-20 DIAGNOSIS — M6281 Muscle weakness (generalized): Secondary | ICD-10-CM

## 2018-12-20 DIAGNOSIS — M25561 Pain in right knee: Secondary | ICD-10-CM

## 2018-12-20 DIAGNOSIS — M25661 Stiffness of right knee, not elsewhere classified: Secondary | ICD-10-CM

## 2018-12-20 DIAGNOSIS — R262 Difficulty in walking, not elsewhere classified: Secondary | ICD-10-CM

## 2018-12-20 NOTE — Therapy (Signed)
[facility] [address]  [place], [place], [address] Phone: [phone]   Fax:  [phone]  Physical Therapy Treatment  Patient Details  Name: Sandra Shea MRN: [id number] Date of Birth: 1965-07-30 Referring Provider (PT): Meredith Pel, MD   Encounter Date: 12/20/2018  PT End of Session - 12/20/18 1747    Visit Number  7    Number of Visits  13    Date for PT Re-Evaluation  01/11/19    Authorization Type  State BCBS    PT Start Time  1702    PT Stop Time  1754    PT Time Calculation (min)  52 min    Activity Tolerance  Patient tolerated treatment well;Patient limited by pain    Behavior During Therapy  [facility] for tasks assessed/performed       Past Medical History:  Diagnosis Date  . Arthritis   . Hypertension     Past Surgical History:  Procedure Laterality Date  . KNEE SURGERY    . TOTAL KNEE ARTHROPLASTY Right 11/11/2018  . TOTAL KNEE ARTHROPLASTY Right 11/11/2018   Procedure: RIGHT TOTAL KNEE ARTHROPLASTY;  Surgeon: Meredith Pel, MD;  Location: [facility];  Service: Orthopedics;  Laterality: Right;    There were no vitals filed for this visit.  Subjective Assessment - 12/20/18 1704    Subjective  Reports that she has been feeling fine. MD advised her that he wants her to 110 degrees. Believes that the tape helped her swelling.    Pertinent History  HTN    Patient Stated Goals  get off the walker    Currently in Pain?  No/denies                       [facility] Adult PT Treatment/Exercise - 12/20/18 0001      Knee/Hip Exercises: Stretches   Personal assistant reps;30 seconds    Quad Stretch Limitations  prone with strap    Other Knee/Hip Stretches  R knee flexion stretch 5x10"       Knee/Hip Exercises: Aerobic   Recumbent Bike  Lvl 1, 6 min - partial revolutions for ROM       Vasopneumatic   Number Minutes Vasopneumatic   10 minutes    Vasopnuematic Location   Knee   R    Vasopneumatic Pressure  Medium    Vasopneumatic Temperature   coldest temp.       Manual Therapy   Manual Therapy  Joint mobilization;Soft tissue mobilization;Muscle Energy Technique;Passive ROM    Manual therapy comments  supine, prone    Joint Mobilization  R patellar mobs all directions- good mobility; R knee posterior tibial mobs for knee flexion grade III to tolerance    palpable popping with patellar mobs but nonpainful   Soft tissue mobilization  R knee scar massage to entire length of scar    Passive ROM  prone R knee flexion 5x20" to tolerance    Muscle Energy Technique  prone R knee extension MET 5x10"             PT Education - 12/20/18 1747    Education Details  update to HEP    Person(s) Educated  Patient    Methods  Explanation;Demonstration;Tactile cues;Verbal cues;Handout    Comprehension  Verbalized understanding;Returned demonstration       PT Short Term Goals - 12/15/18 1651      PT SHORT TERM GOAL #1  Title  Patient to be independent with initial HEP.    Time  3    Period  Weeks    Status  Achieved    Target Date  12/21/18        PT Long Term Goals - 12/15/18 1653      PT LONG TERM GOAL #1   Title  Patient to be independent with advanced HEP.    Time  6    Period  Weeks    Status  Partially Met   met for current     PT LONG TERM GOAL #2   Title  Patient to demonstrate R knee AROM 0-120 degrees.    Time  6    Period  Weeks    Status  Partially Met   AROM 0-85 degrees, PROM -3-87 degrees     PT LONG TERM GOAL #3   Title  Patient to demonstrate B LE strength >=4+/5.    Time  6    Period  Weeks    Status  Partially Met   demonstrating improvements in R hip flexion, B hip abduction, R knee flexion/extension, and B ankle plantarflexion strength     PT LONG TERM GOAL #4   Title  Patient to demonstrate R SLR without quad lag.    Time  6    Period  Weeks    Status  On-going   still evident quad lag     PT LONG TERM GOAL #5   Title   Patient to demonstrate symmetrical step length, knee flexion, and weight shift with ambulation with LRAD.    Time  6    Period  Weeks    Status  On-going   still demonstrating R/L lateral trunk lean and antalgia on R LE with [place] - 12/20/18 1748    Clinical Impression Statement  Patient with no new complaints. Reports that her MD would like her to 110 degrees of flexion. Provided manual therapy to R knee for improvement in scar tissue pliability and knee flexion. Patient continues to demonstrate good patellar mobility, however with decreased tolerance for proximal tibial mobs today. Thus, worked on improving knee flexion with prone knee extension MET and knee flexion PROM which was better tolerated. R knee incision with visible improvement in smooth appearance after scar massage. Updated HEP with knee flexion stretch in sitting for continued ROM improvements. Patient reported understanding. Ended session with Gameready to R knee for pain relief. No complaints at end of session.    Comorbidities  HTN    Rehab Potential  Good    PT Treatment/Interventions  ADLs/Self Care Home Management;Cryotherapy;Electrical Stimulation;Moist Heat;Balance training;Therapeutic exercise;Therapeutic activities;Functional mobility training;Stair training;Gait training;Ultrasound;Neuromuscular re-education;Patient/family education;Manual techniques;Vasopneumatic Device;Taping;Splinting;Energy conservation;Dry needling;Passive range of motion;Scar mobilization    PT Next Visit Plan  Monitor technique/tolerance to updated HEP; R knee ROM; edema management; vasoneumatic compression    Consulted and Agree with Plan of Care  Patient       Patient will benefit from skilled therapeutic intervention in order to improve the following deficits and impairments:  Decreased endurance, Hypomobility, Increased edema, Decreased scar mobility, Decreased activity tolerance, Pain, Decreased balance, Decreased mobility,  Difficulty walking, Improper body mechanics, Decreased range of motion, Impaired flexibility, Postural dysfunction  Visit Diagnosis: Acute pain of right knee  Stiffness of right knee, not elsewhere classified  Muscle weakness (generalized)  Difficulty in walking, not elsewhere classified     Problem List Patient Active Problem  List   Diagnosis Date Noted  . Arthritis of knee 11/11/2018  . Osteoarthritis of left knee 10/18/2014  . Unspecified essential hypertension 09/24/2012  . Obesity, unspecified 09/24/2012    Janene Harvey, PT, DPT 12/20/18 6:04 PM    [facility] [address]  [place], [place], [address] Phone: [phone]   Fax:  [phone]  Name: Sandra Shea MRN: [id number] Date of Birth: 14-Dec-1965

## 2018-12-22 ENCOUNTER — Other Ambulatory Visit: Payer: Self-pay

## 2018-12-22 ENCOUNTER — Ambulatory Visit: Payer: BC Managed Care – PPO

## 2018-12-22 DIAGNOSIS — M25561 Pain in right knee: Secondary | ICD-10-CM

## 2018-12-22 DIAGNOSIS — M6281 Muscle weakness (generalized): Secondary | ICD-10-CM

## 2018-12-22 DIAGNOSIS — R262 Difficulty in walking, not elsewhere classified: Secondary | ICD-10-CM

## 2018-12-22 DIAGNOSIS — M25661 Stiffness of right knee, not elsewhere classified: Secondary | ICD-10-CM

## 2018-12-22 NOTE — Therapy (Signed)
[facility] [address]  [place], [place], [address] Phone: [phone]   Fax:  [phone]  Physical Therapy Treatment  Patient Details  Name: Sandra Shea MRN: [id number] Date of Birth: 1965/11/14 Referring Provider (PT): Meredith Pel, MD   Encounter Date: 12/22/2018  PT End of Session - 12/22/18 1704    Visit Number  8    Number of Visits  13    Date for PT Re-Evaluation  01/11/19    Authorization Type  State BCBS    PT Start Time  1700    PT Stop Time  1745    PT Time Calculation (min)  45 min    Activity Tolerance  Patient tolerated treatment well;Patient limited by pain    Behavior During Therapy  [facility] for tasks assessed/performed       Past Medical History:  Diagnosis Date  . Arthritis   . Hypertension     Past Surgical History:  Procedure Laterality Date  . KNEE SURGERY    . TOTAL KNEE ARTHROPLASTY Right 11/11/2018  . TOTAL KNEE ARTHROPLASTY Right 11/11/2018   Procedure: RIGHT TOTAL KNEE ARTHROPLASTY;  Surgeon: Meredith Pel, MD;  Location: [facility];  Service: Orthopedics;  Laterality: Right;    There were no vitals filed for this visit.  Subjective Assessment - 12/22/18 1701    Subjective  Pt. doing well today .    Pertinent History  HTN    Patient Stated Goals  get off the walker    Currently in Pain?  Yes    Pain Score  4     Pain Location  Knee    Pain Orientation  Right    Pain Descriptors / Indicators  Dull    Pain Type  Acute pain;Surgical pain    Multiple Pain Sites  No         [facility] PT Assessment - 12/22/18 0001      Assessment   Medical Diagnosis  Unilateral primary OA; s/p R TKA    Referring Provider (PT)  Meredith Pel, MD    Onset Date/Surgical Date  11/11/18    Next MD Visit  01/14/19      AROM   AROM Assessment Site  Knee    Right/Left Knee  Right    Right Knee Extension  0    Right Knee Flexion  93      PROM   PROM Assessment Site  Knee    Right/Left Knee  Right    Right Knee Extension  -3    Right Knee Flexion  97                   [facility] Adult PT Treatment/Exercise - 12/22/18 0001      Knee/Hip Exercises: Stretches   Personal assistant reps;30 seconds    Quad Stretch Limitations  prone with strap    Knee: Self-Stretch to increase Flexion  --   5" x 10 reps      Knee/Hip Exercises: Aerobic   Recumbent Bike  Lvl 1, 6 min - partial revolutions for ROM       Knee/Hip Exercises: Standing   Functional Squat  15 reps;3 seconds    Functional Squat Limitations  functional flexion stretch at counter       Knee/Hip Exercises: Supine   Bridges  10 reps;Strengthening;Both    Straight Leg Raises  Right;15 reps;Strengthening    Straight Leg Raises Limitations  quad  lag      Vasopneumatic   Number Minutes Vasopneumatic   10 minutes    Vasopnuematic Location   Knee    Vasopneumatic Pressure  Medium    Vasopneumatic Temperature   coldest temp.       Manual Therapy   Manual Therapy  Joint mobilization;Soft tissue mobilization;Muscle Energy Technique;Passive ROM    Manual therapy comments  supine, prone    Joint Mobilization  R patellar mobs all directions for ROM     Soft tissue mobilization  R knee scar massage to entire length of scar    Muscle Energy Technique  Prone R quad contract/relax stretch x 3 rounds    visible improvement in ROM following              PT Short Term Goals - 12/15/18 1651      PT SHORT TERM GOAL #1   Title  Patient to be independent with initial HEP.    Time  3    Period  Weeks    Status  Achieved    Target Date  12/21/18        PT Long Term Goals - 12/15/18 1653      PT LONG TERM GOAL #1   Title  Patient to be independent with advanced HEP.    Time  6    Period  Weeks    Status  Partially Met   met for current     PT LONG TERM GOAL #2   Title  Patient to demonstrate R knee AROM 0-120 degrees.    Time  6    Period  Weeks    Status  Partially Met   AROM  0-85 degrees, PROM -3-87 degrees     PT LONG TERM GOAL #3   Title  Patient to demonstrate B LE strength >=4+/5.    Time  6    Period  Weeks    Status  Partially Met   demonstrating improvements in R hip flexion, B hip abduction, R knee flexion/extension, and B ankle plantarflexion strength     PT LONG TERM GOAL #4   Title  Patient to demonstrate R SLR without quad lag.    Time  6    Period  Weeks    Status  On-going   still evident quad lag     PT LONG TERM GOAL #5   Title  Patient to demonstrate symmetrical step length, knee flexion, and weight shift with ambulation with LRAD.    Time  6    Period  Weeks    Status  On-going   still demonstrating R/L lateral trunk lean and antalgia on R LE with [place] - 12/22/18 1706    Clinical Impression Statement  Sandra Shea doing well today.  Able to demo improved R knee flexion AROM to 93 dg, PROM flexion 97 dg.  Reports she started performing prone quad stretch at home a few days and able to see visible improvement in R knee flexion ROM following MT and contract/relax PNF flexion stretching.  Added greater focus on functional flexion stretching today with end range functional squat and lunge flexion stretch.  Ended visit with ice/compression to R knee to reduce pain and swelling.    Personal Factors and Comorbidities  Age;Fitness;Past/Current Experience;Profession;Time since onset of injury/illness/exacerbation;Comorbidity 1    Examination-Activity Limitations  Locomotion Level;Lift;Hygiene/Grooming;Transfers;Dressing;Toileting;Carry;Stand;Caring for Others;Stairs;Squat;Bend;Sleep;Sit;Bed Mobility;Bathing    Rehab Potential  Good    PT  Treatment/Interventions  ADLs/Self Care Home Management;Cryotherapy;Electrical Stimulation;Moist Heat;Balance training;Therapeutic exercise;Therapeutic activities;Functional mobility training;Stair training;Gait training;Ultrasound;Neuromuscular re-education;Patient/family education;Manual  techniques;Vasopneumatic Device;Taping;Splinting;Energy conservation;Dry needling;Passive range of motion;Scar mobilization    PT Next Visit Plan  Monitor technique/tolerance to updated HEP; R knee ROM; edema management; vasoneumatic compression    Consulted and Agree with Plan of Care  Patient       Patient will benefit from skilled therapeutic intervention in order to improve the following deficits and impairments:  Decreased endurance, Hypomobility, Increased edema, Decreased scar mobility, Decreased activity tolerance, Pain, Decreased balance, Decreased mobility, Difficulty walking, Improper body mechanics, Decreased range of motion, Impaired flexibility, Postural dysfunction  Visit Diagnosis: Acute pain of right knee  Stiffness of right knee, not elsewhere classified  Muscle weakness (generalized)  Difficulty in walking, not elsewhere classified     Problem List Patient Active Problem List   Diagnosis Date Noted  . Arthritis of knee 11/11/2018  . Osteoarthritis of left knee 10/18/2014  . Unspecified essential hypertension 09/24/2012  . Obesity, unspecified 09/24/2012    Bess Harvest, PTA 12/22/18 6:08 PM   [facility] [address]  [place], [place], [address] Phone: [phone]   Fax:  [phone]  Name: Sandra Shea MRN: [id number] Date of Birth: May 06, 1965

## 2018-12-29 ENCOUNTER — Ambulatory Visit: Payer: BC Managed Care – PPO | Attending: Orthopedic Surgery | Admitting: Physical Therapy

## 2018-12-29 ENCOUNTER — Other Ambulatory Visit: Payer: Self-pay

## 2018-12-29 ENCOUNTER — Encounter: Payer: Self-pay | Admitting: Physical Therapy

## 2018-12-29 DIAGNOSIS — M25561 Pain in right knee: Secondary | ICD-10-CM | POA: Diagnosis present

## 2018-12-29 DIAGNOSIS — M6281 Muscle weakness (generalized): Secondary | ICD-10-CM | POA: Diagnosis present

## 2018-12-29 DIAGNOSIS — R262 Difficulty in walking, not elsewhere classified: Secondary | ICD-10-CM | POA: Insufficient documentation

## 2018-12-29 DIAGNOSIS — M25661 Stiffness of right knee, not elsewhere classified: Secondary | ICD-10-CM | POA: Insufficient documentation

## 2018-12-29 NOTE — Therapy (Signed)
[facility] [address]  [place], [place], [address] Phone: [phone]   Fax:  [phone]  Physical Therapy Treatment  Patient Details  Name: Sandra Shea MRN: [id number] Date of Birth: Feb 28, 1966 Referring Provider (PT): Meredith Pel, MD   Encounter Date: 12/29/2018  PT End of Session - 12/29/18 1359    Visit Number  9    Number of Visits  13    Date for PT Re-Evaluation  01/11/19    Authorization Type  State BCBS    PT Start Time  3536    PT Stop Time  1407    PT Time Calculation (min)  52 min    Activity Tolerance  Patient tolerated treatment well;Patient limited by pain    Behavior During Therapy  [facility] for tasks assessed/performed       Past Medical History:  Diagnosis Date  . Arthritis   . Hypertension     Past Surgical History:  Procedure Laterality Date  . KNEE SURGERY    . TOTAL KNEE ARTHROPLASTY Right 11/11/2018  . TOTAL KNEE ARTHROPLASTY Right 11/11/2018   Procedure: RIGHT TOTAL KNEE ARTHROPLASTY;  Surgeon: Meredith Pel, MD;  Location: [facility];  Service: Orthopedics;  Laterality: Right;    There were no vitals filed for this visit.  Subjective Assessment - 12/29/18 1317    Subjective  Reports that she is fine. Still having some issues with tightness. Having some more pain for the past couple of days- may have been d/t the rain.    Pertinent History  HTN    Patient Stated Goals  get off the walker    Currently in Pain?  Yes    Pain Score  8     Pain Location  Knee    Pain Orientation  Right    Pain Descriptors / Indicators  Dull    Pain Type  Acute pain;Surgical pain                       [facility] Adult PT Treatment/Exercise - 12/29/18 0001      Knee/Hip Exercises: Aerobic   Recumbent Bike  Lvl 1, 6 min - full revolutions       Knee/Hip Exercises: Standing   Hip Flexion  Stengthening;Both;1 set;20 reps;Knee bent    Hip Flexion Limitations  alt resisted hip  flexion at counter top    Hip Abduction  Stengthening;Right;1 set;10 reps;Knee straight    Abduction Limitations  at counter; 1#   cues to avoid lateral and anterior trunk lean   Forward Step Up  Right;1 set;10 reps;Hand Hold: 1;Step Height: 4"    Forward Step Up Limitations  cues for upright posture    Functional Squat  1 set;10 reps    Functional Squat Limitations  at counter top    SLS  R SLS 2x30" with cues for R lateral hip activation      Knee/Hip Exercises: Supine   Heel Slides  AAROM;Right;1 set;10 reps    Heel Slides Limitations  10x5" to tolerance with strap and pball    Bridges  Strengthening;Both;1 set;10 reps    Bridges Limitations  better tolerance      Knee/Hip Exercises: Sidelying   Hip ABduction  Strengthening;Right;1 set;10 reps    Hip ABduction Limitations  manual cues for alignment      Vasopneumatic   Number Minutes Vasopneumatic   10 minutes    Vasopnuematic Location   Knee  R   Vasopneumatic Pressure  Medium    Vasopneumatic Temperature   coldest temp.       Manual Therapy   Manual Therapy  Joint mobilization;Soft tissue mobilization;Muscle Energy Technique;Passive ROM    Manual therapy comments  supine    Joint Mobilization  R knee proximal tibial posterior jt mobilizations grade III to tolerance with prolonged holds of AROM knee flexion to tolerance               PT Short Term Goals - 12/15/18 1651      PT SHORT TERM GOAL #1   Title  Patient to be independent with initial HEP.    Time  3    Period  Weeks    Status  Achieved    Target Date  12/21/18        PT Long Term Goals - 12/15/18 1653      PT LONG TERM GOAL #1   Title  Patient to be independent with advanced HEP.    Time  6    Period  Weeks    Status  Partially Met   met for current     PT LONG TERM GOAL #2   Title  Patient to demonstrate R knee AROM 0-120 degrees.    Time  6    Period  Weeks    Status  Partially Met   AROM 0-85 degrees, PROM -3-87 degrees     PT  LONG TERM GOAL #3   Title  Patient to demonstrate B LE strength >=4+/5.    Time  6    Period  Weeks    Status  Partially Met   demonstrating improvements in R hip flexion, B hip abduction, R knee flexion/extension, and B ankle plantarflexion strength     PT LONG TERM GOAL #4   Title  Patient to demonstrate R SLR without quad lag.    Time  6    Period  Weeks    Status  On-going   still evident quad lag     PT LONG TERM GOAL #5   Title  Patient to demonstrate symmetrical step length, knee flexion, and weight shift with ambulation with LRAD.    Time  6    Period  Weeks    Status  On-going   still demonstrating R/L lateral trunk lean and antalgia on R LE with [facility]           Plan - 12/29/18 1400    Clinical Impression Statement  Patient reporting fluctuations in R knee pain and tightness. Has returned to driving and was able to take down wallpaper this week.  Tolerated joint mobilizations for increased knee flexion with mild pain. Still struggling with sidelying abduction, but able to complete 10 reps with manual cues for alignment. Also able to perform standing abduction with addition of increased weighted resistance. Introduced step ups with patient demonstrating anterior trunk lean and heavy stepping. Excellent form demonstrated with squats at countertop. Patient reporting R knee tightness after prolonged standing at counter, thus ended session with [place] to R knee. No complaints upon leaving.    Personal Factors and Comorbidities  Age;Fitness;Past/Current Experience;Profession;Time since onset of injury/illness/exacerbation;Comorbidity 1    Examination-Activity Limitations  Locomotion Level;Lift;Hygiene/Grooming;Transfers;Dressing;Toileting;Carry;Stand;Caring for Others;Stairs;Squat;Bend;Sleep;Sit;Bed Mobility;Bathing    Rehab Potential  Good    PT Treatment/Interventions  ADLs/Self Care Home Management;Cryotherapy;Electrical Stimulation;Moist Heat;Balance training;Therapeutic  exercise;Therapeutic activities;Functional mobility training;Stair training;Gait training;Ultrasound;Neuromuscular re-education;Patient/family education;Manual techniques;Vasopneumatic Device;Taping;Splinting;Energy conservation;Dry needling;Passive range of motion;Scar mobilization    PT Next  Visit Plan  Monitor technique/tolerance to updated HEP; R knee ROM; edema management; vasoneumatic compression    Consulted and Agree with Plan of Care  Patient       Patient will benefit from skilled therapeutic intervention in order to improve the following deficits and impairments:  Decreased endurance, Hypomobility, Increased edema, Decreased scar mobility, Decreased activity tolerance, Pain, Decreased balance, Decreased mobility, Difficulty walking, Improper body mechanics, Decreased range of motion, Impaired flexibility, Postural dysfunction  Visit Diagnosis: Acute pain of right knee  Stiffness of right knee, not elsewhere classified  Muscle weakness (generalized)  Difficulty in walking, not elsewhere classified     Problem List Patient Active Problem List   Diagnosis Date Noted  . Arthritis of knee 11/11/2018  . Osteoarthritis of left knee 10/18/2014  . Unspecified essential hypertension 09/24/2012  . Obesity, unspecified 09/24/2012     Janene Harvey, PT, DPT 12/29/18 2:10 PM   [facility] [address]  [place], [place], [address] Phone: [phone]   Fax:  [phone]  Name: Sandra Shea MRN: [id number] Date of Birth: 02/11/1966

## 2018-12-31 ENCOUNTER — Other Ambulatory Visit: Payer: Self-pay

## 2018-12-31 ENCOUNTER — Encounter: Payer: Self-pay | Admitting: Physical Therapy

## 2018-12-31 ENCOUNTER — Ambulatory Visit: Payer: BC Managed Care – PPO | Admitting: Physical Therapy

## 2018-12-31 DIAGNOSIS — M25561 Pain in right knee: Secondary | ICD-10-CM | POA: Diagnosis not present

## 2018-12-31 DIAGNOSIS — M6281 Muscle weakness (generalized): Secondary | ICD-10-CM

## 2018-12-31 DIAGNOSIS — R262 Difficulty in walking, not elsewhere classified: Secondary | ICD-10-CM

## 2018-12-31 DIAGNOSIS — M25661 Stiffness of right knee, not elsewhere classified: Secondary | ICD-10-CM

## 2018-12-31 NOTE — Therapy (Signed)
[facility] [address]  [place], [place], [address] Phone: [phone]   Fax:  [phone]  Physical Therapy Progress Note  Patient Details  Name: Sandra Shea MRN: [id number] Date of Birth: [birth year] Referring Provider (PT): Meredith Pel, MD   Encounter Date: 12/31/2018  PT End of Session - 12/31/18 1121    Visit Number  10    Number of Visits  13    Date for PT Re-Evaluation  01/11/19    Authorization Type  State BCBS    PT Start Time  0804    PT Stop Time  0843    PT Time Calculation (min)  39 min    Activity Tolerance  Patient tolerated treatment well;Patient limited by pain    Behavior During Therapy  [facility] for tasks assessed/performed       Past Medical History:  Diagnosis Date  . Arthritis   . Hypertension     Past Surgical History:  Procedure Laterality Date  . KNEE SURGERY    . TOTAL KNEE ARTHROPLASTY Right 11/11/2018  . TOTAL KNEE ARTHROPLASTY Right 11/11/2018   Procedure: RIGHT TOTAL KNEE ARTHROPLASTY;  Surgeon: Meredith Pel, MD;  Location: [facility];  Service: Orthopedics;  Laterality: Right;    There were no vitals filed for this visit.  Subjective Assessment - 12/31/18 0806    Subjective  Feels about the same, just feeling some tightness in the knee. Reports >50% improvement in the L knee. Would like to continue working towards getting her quads stronger and being able to perform sidelying hip abduction.    Pertinent History  HTN    Patient Stated Goals  get off the walker    Currently in Pain?  No/denies         [facility] PT Assessment - 12/31/18 0001      AROM   Right Knee Extension  0    Right Knee Flexion  95      PROM   Right Knee Extension  -3    Right Knee Flexion  97      Strength   Right Hip Flexion  4+/5    Right Hip ABduction  4/5    Right Hip ADduction  4+/5    Left Hip Flexion  5/5    Left Hip ABduction  4/5    Left Hip ADduction  4+/5    Right Knee  Flexion  4+/5    Right Knee Extension  4+/5    Left Knee Flexion  4+/5    Left Knee Extension  4+/5    Right Ankle Dorsiflexion  4+/5    Right Ankle Plantar Flexion  4+/5    Left Ankle Dorsiflexion  4+/5    Left Ankle Plantar Flexion  4+/5                   [facility] Adult PT Treatment/Exercise - 12/31/18 0001      Knee/Hip Exercises: Aerobic   Recumbent Bike  Lvl 1, 6 min - full revolutions       Knee/Hip Exercises: Seated   Long Arc Quad  Right;10 reps;Strengthening;1 set    [employer] Weight  3 lbs.    Other Seated Knee/Hip Exercises  sitting L knee flexion stretch 5x5" to tolerance    Hamstring Curl  Right;15 reps;Strengthening    Hamstring Limitations  blue TB      Knee/Hip Exercises: Supine   Straight Leg Raises  Right;Strengthening;1 set;10  reps    Straight Leg Raises Limitations  quad lag and decreased eccentric control      Manual Therapy   Manual Therapy  Joint mobilization;Soft tissue mobilization;Muscle Energy Technique;Passive ROM    Manual therapy comments  supine    Joint Mobilization  R knee proximal tibial posterior jt mobilizations grade III to tolerance with prolonged holds of AROM knee flexion to tolerance             PT Education - 12/31/18 1121    Education Details  discussion on objkective progress with PT; advised to continue performing SLR at home as she is still demonstrating weakness and increase walking time incrementally    Person(s) Educated  Patient    Methods  Explanation;Demonstration;Tactile cues;Verbal cues    Comprehension  Verbalized understanding;Returned demonstration       PT Short Term Goals - 12/31/18 0810      PT SHORT TERM GOAL #1   Title  Patient to be independent with initial HEP.    Time  3    Period  Weeks    Status  Achieved    Target Date  12/21/18        PT Long Term Goals - 12/31/18 0811      PT LONG TERM GOAL #1   Title  Patient to be independent with advanced HEP.    Time  6    Period   Weeks    Status  Partially Met   met for current     PT LONG TERM GOAL #2   Title  Patient to demonstrate R knee AROM 0-120 degrees.    Time  6    Period  Weeks    Status  Partially Met   AROM 0-95 degrees, PROM -3-97 degrees     PT LONG TERM GOAL #3   Title  Patient to demonstrate B LE strength >=4+/5.    Time  6    Period  Weeks    Status  Partially Met   demonstrating improvements in R hip flexion, R knee flexion and extension; most limited in B hip abduction strength     PT LONG TERM GOAL #4   Title  Patient to demonstrate R SLR without quad lag.    Time  6    Period  Weeks    Status  On-going   quad lag and decreased eccentric control     PT LONG TERM GOAL #5   Title  Patient to demonstrate symmetrical step length, knee flexion, and weight shift with ambulation with LRAD.    Time  6    Period  Weeks    Status  On-going   still demonstrating R/L lateral trunk lean and antalgia on R LE with/without [facility]           Plan - 12/31/18 1124    Clinical Impression Statement  Patient reporting >50% improvement in R knee since working with therapy. Would like to continue working on her strength as well as be able to perform sidelying hip abduction on the R side in order to make bed mobility easier. Worked on manual therapy to patient's tolerance to improve R knee flexion. Patient is now reaching R knee AROM 0-95 degrees, PROM -3-97 degrees. Strength testing revealed improvements in R hip flexion, R knee flexion and extension; most limited in B hip abduction strength at this time. Still demonstrating quad lag and decreased eccentric control with SLR and gait deviations with/without SPC. Patient admitted to not performing  SLR at home. Advised patient to continue performing SLR as she is still demonstrating weakness. Patient reported understanding. Patient tolerated session well and without complaints. Progressing gradually towards goals and would continue to benefit from skilled PT  services.    Comorbidities  HTN    Rehab Potential  Good    PT Treatment/Interventions  ADLs/Self Care Home Management;Cryotherapy;Electrical Stimulation;Moist Heat;Balance training;Therapeutic exercise;Therapeutic activities;Functional mobility training;Stair training;Gait training;Ultrasound;Neuromuscular re-education;Patient/family education;Manual techniques;Vasopneumatic Device;Taping;Splinting;Energy conservation;Dry needling;Passive range of motion;Scar mobilization    PT Next Visit Plan  Monitor technique/tolerance to updated HEP; R knee ROM; edema management; vasoneumatic compression    Consulted and Agree with Plan of Care  Patient       Patient will benefit from skilled therapeutic intervention in order to improve the following deficits and impairments:  Decreased endurance, Hypomobility, Increased edema, Decreased scar mobility, Decreased activity tolerance, Pain, Decreased balance, Decreased mobility, Difficulty walking, Improper body mechanics, Decreased range of motion, Impaired flexibility, Postural dysfunction  Visit Diagnosis: Acute pain of right knee  Stiffness of right knee, not elsewhere classified  Muscle weakness (generalized)  Difficulty in walking, not elsewhere classified     Problem List Patient Active Problem List   Diagnosis Date Noted  . Arthritis of knee 11/11/2018  . Osteoarthritis of left knee 10/18/2014  . Unspecified essential hypertension 09/24/2012  . Obesity, unspecified 09/24/2012     Janene Harvey, PT, DPT 12/31/18 11:29 AM   [facility] [address]  [place], [place], [address] Phone: [phone]   Fax:  [phone]  Name: NAKYLA BRACCO MRN: [id number] Date of Birth: 12-21-1965

## 2019-01-05 ENCOUNTER — Ambulatory Visit: Payer: BC Managed Care – PPO | Admitting: Physical Therapy

## 2019-01-05 ENCOUNTER — Encounter: Payer: Self-pay | Admitting: Physical Therapy

## 2019-01-05 ENCOUNTER — Other Ambulatory Visit: Payer: Self-pay

## 2019-01-05 DIAGNOSIS — R262 Difficulty in walking, not elsewhere classified: Secondary | ICD-10-CM

## 2019-01-05 DIAGNOSIS — M25661 Stiffness of right knee, not elsewhere classified: Secondary | ICD-10-CM

## 2019-01-05 DIAGNOSIS — M25561 Pain in right knee: Secondary | ICD-10-CM

## 2019-01-05 DIAGNOSIS — M6281 Muscle weakness (generalized): Secondary | ICD-10-CM

## 2019-01-05 NOTE — Therapy (Signed)
[facility] [address]  [place], [place], [address] Phone: [phone]   Fax:  [phone]  Physical Therapy Treatment  Patient Details  Name: Sandra Shea MRN: [id number] Date of Birth: 1966-04-04 Referring Provider (PT): Meredith Pel, MD   Encounter Date: 01/05/2019  PT End of Session - 01/05/19 0843    Visit Number  11    Number of Visits  13    Date for PT Re-Evaluation  01/11/19    Authorization Type  State BCBS    PT Start Time  0813    PT Stop Time  0852    PT Time Calculation (min)  39 min    Activity Tolerance  Patient tolerated treatment well;Patient limited by pain    Behavior During Therapy  [facility] for tasks assessed/performed       Past Medical History:  Diagnosis Date  . Arthritis   . Hypertension     Past Surgical History:  Procedure Laterality Date  . KNEE SURGERY    . TOTAL KNEE ARTHROPLASTY Right 11/11/2018  . TOTAL KNEE ARTHROPLASTY Right 11/11/2018   Procedure: RIGHT TOTAL KNEE ARTHROPLASTY;  Surgeon: Meredith Pel, MD;  Location: [facility];  Service: Orthopedics;  Laterality: Right;    There were no vitals filed for this visit.  Subjective Assessment - 01/05/19 0816    Subjective  Reports that she had a really rough night last night after walking in [employer] for 10-61mn yesterday. Was walking with the cart but her knee gave away on her a couple times.    Pertinent History  HTN    Patient Stated Goals  get off the walker    Currently in Pain?  Yes    Pain Score  9     Pain Location  Knee    Pain Orientation  Right    Pain Descriptors / Indicators  Tingling    Pain Type  Acute pain;Surgical pain                       [facility] Adult PT Treatment/Exercise - 01/05/19 0001      Knee/Hip Exercises: Stretches   Hip Flexor Stretch  Right;2 reps;30 seconds    Hip Flexor Stretch Limitations  strap; mod thomas    discontinued d/t LBP     Knee/Hip Exercises: Aerobic    Recumbent Bike  Lvl 1, 6 min - full revolutions       Knee/Hip Exercises: Standing   Heel Raises  Both;15 reps    Heel Raises Limitations  at counter top    Terminal Knee Extension  Right;Strengthening;Theraband;10 reps    Theraband Level (Terminal Knee Extension)  Level 4 (Blue)    Terminal Knee Extension Limitations  10x5" with PT anchoring     Functional Squat  1 set;15 reps    Functional Squat Limitations  at counter top; depth to tolerance      Knee/Hip Exercises: Supine   Heel Slides  AAROM;Right;1 set;10 reps    Heel Slides Limitations  10x5" to tolerance with strap and pball      Modalities   Modalities  Moist Heat      Moist Heat Therapy   Number Minutes Moist Heat  10 Minutes    Moist Heat Location  Lumbar Spine      Vasopneumatic   Number Minutes Vasopneumatic   10 minutes    Vasopnuematic Location   Knee   R   Vasopneumatic  Pressure  Medium    Vasopneumatic Temperature   coldest temp.                PT Short Term Goals - 12/31/18 0810      PT SHORT TERM GOAL #1   Title  Patient to be independent with initial HEP.    Time  3    Period  Weeks    Status  Achieved    Target Date  12/21/18        PT Long Term Goals - 12/31/18 0811      PT LONG TERM GOAL #1   Title  Patient to be independent with advanced HEP.    Time  6    Period  Weeks    Status  Partially Met   met for current     PT LONG TERM GOAL #2   Title  Patient to demonstrate R knee AROM 0-120 degrees.    Time  6    Period  Weeks    Status  Partially Met   AROM 0-95 degrees, PROM -3-97 degrees     PT LONG TERM GOAL #3   Title  Patient to demonstrate B LE strength >=4+/5.    Time  6    Period  Weeks    Status  Partially Met   demonstrating improvements in R hip flexion, R knee flexion and extension; most limited in B hip abduction strength     PT LONG TERM GOAL #4   Title  Patient to demonstrate R SLR without quad lag.    Time  6    Period  Weeks    Status  On-going    quad lag and decreased eccentric control     PT LONG TERM GOAL #5   Title  Patient to demonstrate symmetrical step length, knee flexion, and weight shift with ambulation with LRAD.    Time  6    Period  Weeks    Status  On-going   still demonstrating R/L lateral trunk lean and antalgia on R LE with/without [facility]           Plan - 01/05/19 0843    Clinical Impression Statement  Patient arrived late to session with report of having a rough night last night after walking in [employer] yesterday for 10-15 minutes. Having B knee pain, LBP, and neck pain. Avoided aggressive manual therapy this session d/t pain and limited time. Worked on gentle LE stretching to patient's tolerance- limited by LBP with modified Thomas stretch, thus switched to prone quad stretch. Worked on standing LE strengthening exercises with intermittent cues to shift to R side. Tolerance limited by LBP. Thus, ended session with [place] to R knee and moist heat to LB for pain relief. Educated patient on importance of continuing with HEP despite flare up in knee pain, with focus on exercises that are better tolerated. No complaints at end of session.    Comorbidities  HTN    Rehab Potential  Good    PT Treatment/Interventions  ADLs/Self Care Home Management;Cryotherapy;Electrical Stimulation;Moist Heat;Balance training;Therapeutic exercise;Therapeutic activities;Functional mobility training;Stair training;Gait training;Ultrasound;Neuromuscular re-education;Patient/family education;Manual techniques;Vasopneumatic Device;Taping;Splinting;Energy conservation;Dry needling;Passive range of motion;Scar mobilization    PT Next Visit Plan  Monitor technique/tolerance to updated HEP; R knee ROM; edema management; vasoneumatic compression    Consulted and Agree with Plan of Care  Patient       Patient will benefit from skilled therapeutic intervention in order to improve the following deficits and impairments:  Decreased endurance,  Hypomobility, Increased edema, Decreased scar mobility, Decreased activity tolerance, Pain, Decreased balance, Decreased mobility, Difficulty walking, Improper body mechanics, Decreased range of motion, Impaired flexibility, Postural dysfunction  Visit Diagnosis: Acute pain of right knee  Stiffness of right knee, not elsewhere classified  Muscle weakness (generalized)  Difficulty in walking, not elsewhere classified     Problem List Patient Active Problem List   Diagnosis Date Noted  . Arthritis of knee 11/11/2018  . Osteoarthritis of left knee 10/18/2014  . Unspecified essential hypertension 09/24/2012  . Obesity, unspecified 09/24/2012     Janene Harvey, PT, DPT 01/05/19 10:29 AM   [facility] [address]  [place], [place], [address] Phone: [phone]   Fax:  [phone]  Name: PEIGHTYN ROBERSON MRN: [id number] Date of Birth: Nov 01, 1965

## 2019-01-07 ENCOUNTER — Ambulatory Visit: Payer: BC Managed Care – PPO

## 2019-01-07 ENCOUNTER — Other Ambulatory Visit: Payer: Self-pay

## 2019-01-07 DIAGNOSIS — M25661 Stiffness of right knee, not elsewhere classified: Secondary | ICD-10-CM

## 2019-01-07 DIAGNOSIS — M25561 Pain in right knee: Secondary | ICD-10-CM | POA: Diagnosis not present

## 2019-01-07 DIAGNOSIS — R262 Difficulty in walking, not elsewhere classified: Secondary | ICD-10-CM

## 2019-01-07 DIAGNOSIS — M6281 Muscle weakness (generalized): Secondary | ICD-10-CM

## 2019-01-07 NOTE — Therapy (Signed)
[facility] [address]  [place], [place], [address] Phone: [phone]   Fax:  [phone]  Physical Therapy Treatment  Patient Details  Name: Sandra Shea MRN: [id number] Date of Birth: 1965/05/30 Referring Provider (PT): Meredith Pel, MD   Encounter Date: 01/07/2019  PT End of Session - 01/07/19 0818    Visit Number  12    Number of Visits  13    Date for PT Re-Evaluation  01/11/19    Authorization Type  State BCBS    PT Start Time  0813    PT Stop Time  0855    PT Time Calculation (min)  42 min    Activity Tolerance  Patient tolerated treatment well;Patient limited by pain    Behavior During Therapy  [facility] for tasks assessed/performed       Past Medical History:  Diagnosis Date  . Arthritis   . Hypertension     Past Surgical History:  Procedure Laterality Date  . KNEE SURGERY    . TOTAL KNEE ARTHROPLASTY Right 11/11/2018  . TOTAL KNEE ARTHROPLASTY Right 11/11/2018   Procedure: RIGHT TOTAL KNEE ARTHROPLASTY;  Surgeon: Meredith Pel, MD;  Location: [place];  Service: Orthopedics;  Laterality: Right;    There were no vitals filed for this visit.  Subjective Assessment - 01/07/19 0816    Subjective  Pt. reporting her knee has felt somewhat better since last session.    Pertinent History  HTN    Patient Stated Goals  get off the walker    Currently in Pain?  Yes    Pain Score  7     Pain Location  Knee   "tightness"   Pain Orientation  Right    Pain Descriptors / Indicators  --   "tightness"   Pain Type  Acute pain;Surgical pain    Multiple Pain Sites  No         [facility] PT Assessment - 01/07/19 0001      Assessment   Medical Diagnosis  Unilateral primary OA; s/p R TKA    Referring Provider (PT)  Meredith Pel, MD    Onset Date/Surgical Date  11/11/18    Next MD Visit  01/14/19      AROM   AROM Assessment Site  Knee    Right/Left Knee  Right    Right Knee Extension  0     Right Knee Flexion  100      PROM   PROM Assessment Site  Knee    Right/Left Knee  Right    Right Knee Extension  -3    Right Knee Flexion  103                   [facility] Adult PT Treatment/Exercise - 01/07/19 0001      Knee/Hip Exercises: Stretches   Personal assistant reps;30 seconds    Quad Stretch Limitations  prone with strap    Knee: Self-Stretch Limitations  R knee self-flexion stretch 5" x 5 reps leaning into treadmill with lunge flexion stretch       Knee/Hip Exercises: Aerobic   Recumbent Bike  Lvl 1, 6 min - full revolutions       Knee/Hip Exercises: Machines for Strengthening   Cybex Knee Flexion  B LE's: 25# x 15 reps    Cybex Leg Press  B LE's: 25# x 15 reps       Knee/Hip Exercises: Sidelying  Hip ABduction  Right;Left;AROM    Hip ABduction Limitations  tactil cueing to maintain correct hip position       Vasopneumatic   Number Minutes Vasopneumatic   10 minutes    Vasopnuematic Location   Knee    Vasopneumatic Pressure  Medium    Vasopneumatic Temperature   coldest temp.                PT Short Term Goals - 12/31/18 0810      PT SHORT TERM GOAL #1   Title  Patient to be independent with initial HEP.    Time  3    Period  Weeks    Status  Achieved    Target Date  12/21/18        PT Long Term Goals - 12/31/18 0811      PT LONG TERM GOAL #1   Title  Patient to be independent with advanced HEP.    Time  6    Period  Weeks    Status  Partially Met   met for current     PT LONG TERM GOAL #2   Title  Patient to demonstrate R knee AROM 0-120 degrees.    Time  6    Period  Weeks    Status  Partially Met   AROM 0-95 degrees, PROM -3-97 degrees     PT LONG TERM GOAL #3   Title  Patient to demonstrate B LE strength >=4+/5.    Time  6    Period  Weeks    Status  Partially Met   demonstrating improvements in R hip flexion, R knee flexion and extension; most limited in B hip abduction strength     PT LONG TERM GOAL #4    Title  Patient to demonstrate R SLR without quad lag.    Time  6    Period  Weeks    Status  On-going   quad lag and decreased eccentric control     PT LONG TERM GOAL #5   Title  Patient to demonstrate symmetrical step length, knee flexion, and weight shift with ambulation with LRAD.    Time  6    Period  Weeks    Status  On-going   still demonstrating R/L lateral trunk lean and antalgia on R LE with/without [facility]           Plan - 01/07/19 0849    Clinical Impression Statement  Sandra Shea arrived late to session today thus treatment time limited.  Sandra Shea doing well today reporting she feels her back and knee pain has improved since recovering from prolonged walking in [place] last week.  Able to progress to machine strengthening today and demo'd improved R knee flexion AROM to 100 dg, PROM flexion 103 dg.  Pt. still demonstrating lateral proximal hip weakness with ambulating with B Trendelenburg gait pattern.  Focused isolated hip strengthening on glute Medius today for improved hip stability with gait.  Ended visit with ice/compression to R knee to reduce post exercise swelling and pain.  Pt. progressing well toward goals.    Personal Factors and Comorbidities  Age;Fitness;Past/Current Experience;Profession;Time since onset of injury/illness/exacerbation;Comorbidity 1    Comorbidities  HTN    Rehab Potential  Good    PT Treatment/Interventions  ADLs/Self Care Home Management;Cryotherapy;Electrical Stimulation;Moist Heat;Balance training;Therapeutic exercise;Therapeutic activities;Functional mobility training;Stair training;Gait training;Ultrasound;Neuromuscular re-education;Patient/family education;Manual techniques;Vasopneumatic Device;Taping;Splinting;Energy conservation;Dry needling;Passive range of motion;Scar mobilization    PT Next Visit Plan  Monitor technique/tolerance to updated HEP; R  knee ROM; edema management; vasoneumatic compression    Consulted and Agree with Plan of Care  Patient        Patient will benefit from skilled therapeutic intervention in order to improve the following deficits and impairments:  Decreased endurance, Hypomobility, Increased edema, Decreased scar mobility, Decreased activity tolerance, Pain, Decreased balance, Decreased mobility, Difficulty walking, Improper body mechanics, Decreased range of motion, Impaired flexibility, Postural dysfunction  Visit Diagnosis: Acute pain of right knee  Stiffness of right knee, not elsewhere classified  Muscle weakness (generalized)  Difficulty in walking, not elsewhere classified     Problem List Patient Active Problem List   Diagnosis Date Noted  . Arthritis of knee 11/11/2018  . Osteoarthritis of left knee 10/18/2014  . Unspecified essential hypertension 09/24/2012  . Obesity, unspecified 09/24/2012    Bess Harvest, PTA 01/07/19 12:16 PM   [facility] [address]  [place], [place], [address] Phone: [phone]   Fax:  [phone]  Name: Sandra Shea MRN: [id number] Date of Birth: 1966-02-08

## 2019-01-10 ENCOUNTER — Encounter: Payer: Self-pay | Admitting: Physical Therapy

## 2019-01-10 ENCOUNTER — Other Ambulatory Visit: Payer: Self-pay

## 2019-01-10 ENCOUNTER — Ambulatory Visit: Payer: BC Managed Care – PPO | Admitting: Physical Therapy

## 2019-01-10 DIAGNOSIS — M25661 Stiffness of right knee, not elsewhere classified: Secondary | ICD-10-CM

## 2019-01-10 DIAGNOSIS — R262 Difficulty in walking, not elsewhere classified: Secondary | ICD-10-CM

## 2019-01-10 DIAGNOSIS — M6281 Muscle weakness (generalized): Secondary | ICD-10-CM

## 2019-01-10 DIAGNOSIS — M25561 Pain in right knee: Secondary | ICD-10-CM | POA: Diagnosis not present

## 2019-01-10 NOTE — Therapy (Addendum)
[facility] [address]  [place], [place], [address] Phone: [phone]   Fax:  [phone]  Physical Therapy Treatment  Patient Details  Name: Sandra Shea MRN: [id number] Date of Birth: 1966-02-12 Referring Provider (PT): Meredith Pel, MD   Encounter Date: 01/10/2019  PT End of Session - 01/10/19 1237    Visit Number  13    Number of Visits  13    Date for PT Re-Evaluation  01/11/19    Authorization Type  State BCBS    PT Start Time  0813   pt late   PT Stop Time  0846    PT Time Calculation (min)  33 min    Activity Tolerance  Patient tolerated treatment well;Patient limited by pain    Behavior During Therapy  [facility] for tasks assessed/performed       Past Medical History:  Diagnosis Date  . Arthritis   . Hypertension     Past Surgical History:  Procedure Laterality Date  . KNEE SURGERY    . TOTAL KNEE ARTHROPLASTY Right 11/11/2018  . TOTAL KNEE ARTHROPLASTY Right 11/11/2018   Procedure: RIGHT TOTAL KNEE ARTHROPLASTY;  Surgeon: Meredith Pel, MD;  Location: [place];  Service: Orthopedics;  Laterality: Right;    There were no vitals filed for this visit.  Subjective Assessment - 01/10/19 0815    Subjective  Was able to finish painting her bathroom over the weekend. Reports 80-90% improvement in R knee. Feels that she has come a long way. Does not walk with the cane anymore, only to therapy. Would like to continue working on ROM and strengthening quads.Would like to transition to home program at this time and join a gym.    Pertinent History  HTN    Patient Stated Goals  get off the walker    Currently in Pain?  No/denies         [facility] PT Assessment - 01/10/19 0001      AROM   Right Knee Extension  0    Right Knee Flexion  100      PROM   Right Knee Extension  -3    Right Knee Flexion  103      Strength   Right Hip Flexion  4+/5    Right Hip ABduction  4/5    Right Hip ADduction   4+/5    Left Hip Flexion  5/5    Left Hip ABduction  4/5    Left Hip ADduction  4+/5    Right Knee Flexion  4+/5    Right Knee Extension  4+/5    Left Knee Flexion  4+/5    Left Knee Extension  4+/5    Right Ankle Dorsiflexion  4+/5    Right Ankle Plantar Flexion  4+/5    Left Ankle Dorsiflexion  4+/5    Left Ankle Plantar Flexion  4+/5                   [facility] Adult PT Treatment/Exercise - 01/10/19 0001      Ambulation/Gait   Gait Pattern  Step-to pattern;Step-through pattern;Decreased stance time - right;Decreased hip/knee flexion - right;Decreased weight shift to right;Decreased step length - left;Poor foot clearance - left;Poor foot clearance - right;Trunk flexed    Ambulation Surface  Level;Indoor    Gait velocity  decreased    Gait Comments  very visible lateral trunk lean d/t hip weakness; good use of knee flexion during  swing through      Knee/Hip Exercises: Supine   Heel Slides  AAROM;Right;1 set;10 reps    Heel Slides Limitations  10x5" to tolerance with strap and pball    Straight Leg Raises  Right;Strengthening;1 set;10 reps    Straight Leg Raises Limitations  quad lag and decreased eccentric control      Manual Therapy   Manual Therapy  Joint mobilization    Manual therapy comments  supine    Joint Mobilization  R knee proximal tibial posterior jt mobilizations grade III to tolerance with prolonged holds of AROM knee flexion to tolerance             PT Education - 01/10/19 1236    Education Details  discussion on objective progress with PT and remaining impairments; edu on consolidated HEP    Person(s) Educated  Patient    Methods  Explanation;Demonstration;Tactile cues;Verbal cues;Handout    Comprehension  Verbalized understanding       PT Short Term Goals - 01/10/19 1237      PT SHORT TERM GOAL #1   Title  Patient to be independent with initial HEP.    Time  3    Period  Weeks    Status  Achieved    Target Date  12/21/18        PT  Long Term Goals - 01/10/19 1238      PT LONG TERM GOAL #1   Title  Patient to be independent with advanced HEP.    Time  6    Period  Weeks    Status  Achieved      PT LONG TERM GOAL #2   Title  Patient to demonstrate R knee AROM 0-120 degrees.    Time  6    Period  Weeks    Status  Partially Met   AROM 0-95 degrees, PROM -3-97 degrees     PT LONG TERM GOAL #3   Title  Patient to demonstrate B LE strength >=4+/5.    Time  6    Period  Weeks    Status  Partially Met   met with exception of B hip abduciton strength     PT LONG TERM GOAL #4   Title  Patient to demonstrate R SLR without quad lag.    Time  6    Period  Weeks    Status  Not Met   quad lag and decreased eccentric control     PT LONG TERM GOAL #5   Title  Patient to demonstrate symmetrical step length, knee flexion, and weight shift with ambulation with LRAD.    Time  6    Period  Weeks    Status  Partially Met   demonstrating good L knee flexion and weight shift to L, but still with quite noticable lateral weight shift throughout- patient reporting that this is baseline for her           Plan - 01/10/19 1243    Clinical Impression Statement  Patient arrived to session late with report of 80-90% improvement in R knee since starting therapy. Patient reports that she no longer walks with her [facility], but brings it to therapy. Would like to continue working on her ROM and quad strength at home, but feels ready to transition to home program and return to the [facility]. L knee AROM measured 0-100 degrees, with PROM -3-103 degrees after manual therapy. Strength goal has been met with the exception of B hip abduction  strength. Patient still demonstrating quad lag and decreased eccentric control with SLR on L LE. Patient is demonstrating good L knee flexion and weight shift to L with ambulation, but still with quite noticeable lateral weight shift throughout. Patient noting that this is baseline for her. Educated patient on her  progress and remaining impairments. Patient requesting to wrap up with therapy at this time- will be returning to work on 01/16/19/. Educated patient on consolidated HEP- patient reported understanding. No complaints at end of session. Placing patient on 30 day hold per patient's request, awaiting next MD F/U on 01/14/19.    Personal Factors and Comorbidities  Age;Fitness;Past/Current Experience;Profession;Time since onset of injury/illness/exacerbation;Comorbidity 1    Comorbidities  HTN    Rehab Potential  Good    PT Treatment/Interventions  ADLs/Self Care Home Management;Cryotherapy;Electrical Stimulation;Moist Heat;Balance training;Therapeutic exercise;Therapeutic activities;Functional mobility training;Stair training;Gait training;Ultrasound;Neuromuscular re-education;Patient/family education;Manual techniques;Vasopneumatic Device;Taping;Splinting;Energy conservation;Dry needling;Passive range of motion;Scar mobilization    PT Next Visit Plan  30 day hold at this time    Consulted and Agree with Plan of Care  Patient       Patient will benefit from skilled therapeutic intervention in order to improve the following deficits and impairments:  Decreased endurance, Hypomobility, Increased edema, Decreased scar mobility, Decreased activity tolerance, Pain, Decreased balance, Decreased mobility, Difficulty walking, Improper body mechanics, Decreased range of motion, Impaired flexibility, Postural dysfunction  Visit Diagnosis: Acute pain of right knee  Stiffness of right knee, not elsewhere classified  Muscle weakness (generalized)  Difficulty in walking, not elsewhere classified     Problem List Patient Active Problem List   Diagnosis Date Noted  . Arthritis of knee 11/11/2018  . Osteoarthritis of left knee 10/18/2014  . Unspecified essential hypertension 09/24/2012  . Obesity, unspecified 09/24/2012    Janene Harvey, PT, DPT 01/10/19 12:46 PM   [facility] [address]  [place], [place], [address] Phone: [phone]   Fax:  [phone]  Name: Sandra Shea MRN: [id number] Date of Birth: 1965-08-26   PHYSICAL THERAPY DISCHARGE SUMMARY  Visits from Start of Care: 13  Current functional level related to goals / functional outcomes: See above clinical impression   Remaining deficits: Decreased ROM, strength, and gait deficits    Education / Equipment: HEP  Plan: Patient agrees to discharge.  Patient goals were partially met. Patient is being discharged due to being pleased with the current functional level.  ?????     Janene Harvey, PT, DPT 02/23/19 1:40 PM

## 2019-01-14 ENCOUNTER — Encounter: Payer: Self-pay | Admitting: Orthopedic Surgery

## 2019-01-14 ENCOUNTER — Ambulatory Visit (INDEPENDENT_AMBULATORY_CARE_PROVIDER_SITE_OTHER): Payer: BC Managed Care – PPO | Admitting: Orthopedic Surgery

## 2019-01-14 DIAGNOSIS — M1711 Unilateral primary osteoarthritis, right knee: Secondary | ICD-10-CM

## 2019-01-16 ENCOUNTER — Encounter: Payer: Self-pay | Admitting: Orthopedic Surgery

## 2019-01-16 NOTE — Progress Notes (Signed)
   Post-Op Visit Note   Patient: Sandra Shea           Date of Birth: [birth year]           MRN: [id number] Visit Date: 01/14/2019 PCP: Kennyth Arnold, FNP   Assessment & Plan:  Chief Complaint:  Chief Complaint  Patient presents with  . Right Knee - Follow-up   Visit Diagnoses: No diagnosis found.  Plan: Patient is a patient who is now 2 months out right total knee replacement.  She has been discharged from physical therapy and is doing home exercise program.  Not using any assistive devices.  States that the knee still feels tight at times on exam she has good extension and flexion past 90.  Good stability in flexion and pretty reasonable stability and full extension.  Left knee has pain as well.  School returns October 20.  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  I think she should be good on that front.  Follow-Up Instructions: Return in about 4 weeks (around 02/11/2019).   Orders:  No orders of the defined types were placed in this encounter.  No orders of the defined types were placed in this encounter.   Imaging: No results found.  PMFS History: Patient Active Problem List   Diagnosis Date Noted  . Arthritis of knee 11/11/2018  . Osteoarthritis of left knee 10/18/2014  . Unspecified essential hypertension 09/24/2012  . Obesity, unspecified 09/24/2012   Past Medical History:  Diagnosis Date  . Arthritis   . Hypertension     History reviewed. No pertinent family history.  Past Surgical History:  Procedure Laterality Date  . KNEE SURGERY    . TOTAL KNEE ARTHROPLASTY Right 11/11/2018  . TOTAL KNEE ARTHROPLASTY Right 11/11/2018   Procedure: RIGHT TOTAL KNEE ARTHROPLASTY;  Surgeon: Meredith Pel, MD;  Location: [place];  Service: Orthopedics;  Laterality: Right;   Social History   Occupational History  . Not on file  Tobacco Use  . Smoking status: Never Smoker  . Smokeless tobacco:  Never Used  Substance and Sexual Activity  . Alcohol use: Yes    Comment: socially  . Drug use: No  . Sexual activity: Not on file

## 2019-02-11 ENCOUNTER — Other Ambulatory Visit: Payer: Self-pay

## 2019-02-11 ENCOUNTER — Encounter: Payer: Self-pay | Admitting: Orthopedic Surgery

## 2019-02-11 ENCOUNTER — Ambulatory Visit (INDEPENDENT_AMBULATORY_CARE_PROVIDER_SITE_OTHER): Payer: BC Managed Care – PPO | Admitting: Orthopedic Surgery

## 2019-02-11 VITALS — Ht 66.0 in | Wt 279.0 lb

## 2019-02-11 DIAGNOSIS — M1711 Unilateral primary osteoarthritis, right knee: Secondary | ICD-10-CM

## 2019-02-11 DIAGNOSIS — M1712 Unilateral primary osteoarthritis, left knee: Secondary | ICD-10-CM

## 2019-02-11 MED ORDER — MELOXICAM 15 MG PO TABS: 15.0000 mg | ORAL_TABLET | Freq: Every day | ORAL | 0 refills | Status: AC | PRN

## 2019-02-11 MED ORDER — HYDROCODONE-ACETAMINOPHEN 5-325 MG PO TABS: 1.0000 | ORAL_TABLET | Freq: Four times a day (QID) | ORAL | 0 refills | Status: DC | PRN

## 2019-02-11 NOTE — Progress Notes (Signed)
   Post-Op Visit Note   Patient: Sandra Shea           Date of Birth: 1965/10/29           MRN: [id number] Visit Date: 02/11/2019 PCP: Kennyth Arnold, FNP   Assessment & Plan:  Chief Complaint:  Chief Complaint  Patient presents with  . Right Knee - Follow-up    11/11/2018 Right TKA   Visit Diagnoses:  1. Unilateral primary osteoarthritis, right knee   2. Unilateral primary osteoarthritis, left knee     Plan: Wilbur is now about 3 months out right total knee replacement.  Stairs she is doing okay with.  She has a bike.  She is having a little bit of pain in that left iliac crest region but not in the groin.  On exam there is no groin pain with internal X rotation of either leg.  She got good extension and flexion to about 9500 degrees.  I am going to start Mobic and let her return to work 02/15/2019 with no restrictions.  Refill Norco 1 more visit.  Follow-up with me as needed.  Needs antibiotic prophylaxis before any type of procedures over the next 2 years.  Follow-Up Instructions: Return if symptoms worsen or fail to improve.   Orders:  No orders of the defined types were placed in this encounter.  Meds ordered this encounter  Medications  . meloxicam (MOBIC) 15 MG tablet    Sig: Take 1 tablet (15 mg total) by mouth daily as needed for pain.    Dispense:  30 tablet    Refill:  0    Imaging: No results found.  PMFS History: Patient Active Problem List   Diagnosis Date Noted  . Arthritis of knee 11/11/2018  . Osteoarthritis of left knee 10/18/2014  . Unspecified essential hypertension 09/24/2012  . Obesity, unspecified 09/24/2012   Past Medical History:  Diagnosis Date  . Arthritis   . Hypertension     History reviewed. No pertinent family history.  Past Surgical History:  Procedure Laterality Date  . KNEE SURGERY    . TOTAL KNEE ARTHROPLASTY Right 11/11/2018  . TOTAL KNEE ARTHROPLASTY Right 11/11/2018   Procedure: RIGHT TOTAL KNEE ARTHROPLASTY;  Surgeon: Meredith Pel, MD;  Location: [facility];  Service: Orthopedics;  Laterality: Right;   Social History   Occupational History  . Not on file  Tobacco Use  . Smoking status: Never Smoker  . Smokeless tobacco: Never Used  Substance and Sexual Activity  . Alcohol use: Yes    Comment: socially  . Drug use: No  . Sexual activity: Not on file

## 2019-07-14 ENCOUNTER — Ambulatory Visit: Payer: Self-pay

## 2019-07-14 ENCOUNTER — Other Ambulatory Visit: Payer: Self-pay

## 2019-07-14 ENCOUNTER — Ambulatory Visit: Payer: BC Managed Care – PPO | Admitting: Orthopedic Surgery

## 2019-07-14 DIAGNOSIS — M25562 Pain in left knee: Secondary | ICD-10-CM

## 2019-07-14 DIAGNOSIS — M1712 Unilateral primary osteoarthritis, left knee: Secondary | ICD-10-CM | POA: Diagnosis not present

## 2019-07-15 ENCOUNTER — Encounter: Payer: Self-pay | Admitting: Orthopedic Surgery

## 2019-07-15 DIAGNOSIS — M1712 Unilateral primary osteoarthritis, left knee: Secondary | ICD-10-CM

## 2019-07-15 MED ORDER — METHYLPREDNISOLONE ACETATE 40 MG/ML IJ SUSP
40.0000 mg | INTRAMUSCULAR | Status: AC | PRN
  Administered 2019-07-15: 18:00:00 40 mg via INTRA_ARTICULAR

## 2019-07-15 MED ORDER — LIDOCAINE HCL 1 % IJ SOLN
5.0000 mL | INTRAMUSCULAR | Status: AC | PRN
  Administered 2019-07-15: 18:00:00 5 mL

## 2019-07-15 MED ORDER — BUPIVACAINE HCL 0.25 % IJ SOLN
4.0000 mL | INTRAMUSCULAR | Status: AC | PRN
  Administered 2019-07-15: 18:00:00 4 mL via INTRA_ARTICULAR

## 2019-07-15 NOTE — Progress Notes (Signed)
Office Visit Note   Patient: Sandra Shea           Date of Birth: [birth year]           MRN: [id number] Visit Date: 07/14/2019 Requested by: Kennyth Arnold, [place],  [place] [address] PCP: Kennyth Arnold, FNP  Subjective: Chief Complaint  Patient presents with  . Left Knee - Pain    HPI: Julianys is a patient with left knee pain.  She states that she has a locked knee.  She is doing reasonably well with her right total knee replacement.  She is a bus Geophysicist/field seismologist.  She reports feeling a catch in her knee.  She states that she hit a metal shelf in [employer] this morning.  She reports some pain since then.              ROS: All systems reviewed are negative as they relate to the chief complaint within the history of present illness.  Patient denies  fevers or chills.   Assessment & Plan: Visit Diagnoses:  1. Left knee pain, unspecified chronicity     Plan: Impression is left knee severe arthritis with some type of potential mechanical block to full extension.  Mearl Latin try injection with aspiration first.  I really want a fill that knee up with high volume of numbing medicine just to try to unlock the knee if possible.  Cortisone also injected for some pain relief.  Also got her hinged knee brace.  Follow-up as needed.  I think she is going to need knee replacement at sometime in the future.  Nothing obvious on plain radiographs except for severe end-stage arthritis  Follow-Up Instructions: Return if symptoms worsen or fail to improve.   Orders:  Orders Placed This Encounter  Procedures  . XR Knee 1-2 Views Left   No orders of the defined types were placed in this encounter.     Procedures: Large Joint Inj: L knee on 07/15/2019 5:44 PM Indications: diagnostic evaluation, joint swelling and pain Details: 18 G 1.5 in needle, superolateral approach  Arthrogram: No  Medications: 5 mL lidocaine 1 %; 40 mg methylPREDNISolone acetate 40 MG/ML; 4 mL bupivacaine 0.25  % Outcome: tolerated well, no immediate complications Procedure, treatment alternatives, risks and benefits explained, specific risks discussed. Consent was given by the patient. Immediately prior to procedure a time out was called to verify the correct patient, procedure, equipment, support staff and site/side marked as required. Patient was prepped and draped in the usual sterile fashion.       Clinical Data: No additional findings.  Objective: Vital Signs: LMP 11/18/2012   Physical Exam:   Constitutional: Patient appears well-developed HEENT:  Head: Normocephalic Eyes:EOM are normal Neck: Normal range of motion Cardiovascular: Normal rate Pulmonary/chest: Effort normal Neurologic: Patient is alert Skin: Skin is warm Psychiatric: Patient has normal mood and affect    Ortho Exam: Ortho exam demonstrates intact extensor mechanism on the left.  Lacking about 10 degrees of full extension.  Collateral and cruciate ligaments are stable.  Trace effusion present.  Mild patellofemoral crepitus is present.  No patellar apprehension.  No other masses lymphadenopathy or skin changes noted in that left knee region.  Specialty Comments:  No specialty comments available.  Imaging: No results found.   PMFS History: Patient Active Problem List   Diagnosis Date Noted  . Arthritis of knee 11/11/2018  . Osteoarthritis of left knee 10/18/2014  . Unspecified essential hypertension 09/24/2012  .  Obesity, unspecified 09/24/2012   Past Medical History:  Diagnosis Date  . Arthritis   . Hypertension     History reviewed. No pertinent family history.  Past Surgical History:  Procedure Laterality Date  . KNEE SURGERY    . TOTAL KNEE ARTHROPLASTY Right 11/11/2018  . TOTAL KNEE ARTHROPLASTY Right 11/11/2018   Procedure: RIGHT TOTAL KNEE ARTHROPLASTY;  Surgeon: Meredith Pel, MD;  Location: [facility];  Service: Orthopedics;  Laterality: Right;   Social History   Occupational History   . Not on file  Tobacco Use  . Smoking status: Never Smoker  . Smokeless tobacco: Never Used  Substance and Sexual Activity  . Alcohol use: Yes    Comment: socially  . Drug use: No  . Sexual activity: Not on file

## 2019-09-15 ENCOUNTER — Other Ambulatory Visit: Payer: Self-pay | Admitting: Family

## 2019-09-15 MED ORDER — NITROFURANTOIN MONOHYD MACRO 100 MG PO CAPS: 100.0000 mg | ORAL_CAPSULE | Freq: Two times a day (BID) | ORAL | 0 refills | Status: DC

## 2020-11-07 ENCOUNTER — Encounter: Payer: Self-pay | Admitting: Physician Assistant

## 2020-12-05 ENCOUNTER — Encounter: Payer: Self-pay | Admitting: Physician Assistant

## 2020-12-05 ENCOUNTER — Other Ambulatory Visit: Payer: Self-pay | Admitting: Physician Assistant

## 2020-12-05 ENCOUNTER — Telehealth: Payer: Self-pay

## 2020-12-05 ENCOUNTER — Ambulatory Visit (INDEPENDENT_AMBULATORY_CARE_PROVIDER_SITE_OTHER): Payer: Self-pay | Admitting: Physician Assistant

## 2020-12-05 VITALS — BP 128/90 | HR 82 | Ht 66.0 in | Wt 267.0 lb

## 2020-12-05 DIAGNOSIS — Z800 Family history of malignant neoplasm of digestive organs: Secondary | ICD-10-CM

## 2020-12-05 DIAGNOSIS — Z1211 Encounter for screening for malignant neoplasm of colon: Secondary | ICD-10-CM

## 2020-12-05 MED ORDER — NA SULFATE-K SULFATE-MG SULF 17.5-3.13-1.6 GM/177ML PO SOLN: 1.0000 | Freq: Once | ORAL | 0 refills | Status: AC

## 2020-12-05 MED ORDER — PEG 3350-KCL-NA BICARB-NACL 420 G PO SOLR: 4000.0000 mL | Freq: Once | ORAL | 0 refills | Status: AC

## 2020-12-05 NOTE — Progress Notes (Signed)
Chief Complaint: Family history of pancreatic cancer and need for screening colonoscopy  HPI:    Sandra Shea is a 55 year old African-American female with a past medical history as listed below, who was referred to me by Kennyth Arnold, FNP for a complaint of family history of pancreatic cancer and need for screening colonoscopy.    Today, the patient tells me that her mother passed away at the age of 69 from pancreatic cancer.  She is not sure exactly when it was diagnosed but tells me that her primary care physician has already done all the genetic testing and she was negative.  The main reason she is here today she tells me is because she needs a screening colonoscopy.    Does take Phentermine    Denies any GI symptoms including fever, chills, weight loss, change in bowel habits, diarrhea, constipation, abdominal pain, heartburn, reflux, nausea or vomiting.    Past Medical History:  Diagnosis Date   Arthritis    Hypertension     Past Surgical History:  Procedure Laterality Date   BREAST LUMPECTOMY     KNEE SURGERY     TOTAL KNEE ARTHROPLASTY Right 11/11/2018   TOTAL KNEE ARTHROPLASTY Right 11/11/2018   Procedure: RIGHT TOTAL KNEE ARTHROPLASTY;  Surgeon: Meredith Pel, MD;  Location: [facility];  Service: Orthopedics;  Laterality: Right;   TUBAL LIGATION      Current Outpatient Medications  Medication Sig Dispense Refill   aspirin 81 MG chewable tablet Chew 1 tablet (81 mg total) by mouth 2 (two) times daily. 42 tablet 0   docusate sodium (COLACE) 100 MG capsule Take 1 capsule (100 mg total) by mouth 2 (two) times daily. 10 capsule 0   Fluocinolone Acetonide Body 0.01 % OIL fluocinolone 0.01 % scalp oil and shower cap     hydrochlorothiazide (HYDRODIURIL) 12.5 MG tablet Take 12.5 mg by mouth daily.     ibuprofen (ADVIL) 200 MG tablet Take 400 mg by mouth every 6 (six) hours as needed for moderate pain.     ketoconazole (NIZORAL) 2 % shampoo ketoconazole 2 % shampoo      meloxicam (MOBIC) 15 MG tablet Take 1 tablet (15 mg total) by mouth daily as needed for pain. 30 tablet 0   Menthol, Topical Analgesic, (BIOFREEZE EX) Apply 1 application topically 2 (two) times daily as needed (back pain).     methocarbamol (ROBAXIN) 500 MG tablet Take 1 tablet (500 mg total) by mouth every 6 (six) hours as needed for muscle spasms. (Patient not taking: Reported on 02/11/2019) 30 tablet 0   methocarbamol (ROBAXIN) 500 MG tablet Take 1 tablet (500 mg total) by mouth every 8 (eight) hours as needed for muscle spasms. (Patient not taking: Reported on 02/11/2019) 30 tablet 0   nitrofurantoin, macrocrystal-monohydrate, (MACROBID) 100 MG capsule Take 1 capsule (100 mg total) by mouth 2 (two) times daily. 14 capsule 0   oxybutynin (DITROPAN-XL) 5 MG 24 hr tablet Take 5 mg by mouth daily.     phentermine (ADIPEX-P) 37.5 MG tablet Take 37.5 mg by mouth daily.     Semaglutide-Weight Management (WEGOVY) 0.5 MG/0.5ML SOAJ Inject 0.5 mg into the skin once a week.     Topiramate ER (TROKENDI XR) 50 MG CP24 Take 50 mg by mouth daily.     traMADol (ULTRAM) 50 MG tablet Take 50 mg by mouth daily as needed.     No current facility-administered medications for this visit.    Allergies as of 12/05/2020 - Review  Complete 12/05/2020  Allergen Reaction Noted   Sulfa antibiotics Hives 09/24/2012    Family History  Problem Relation Age of Onset   Pancreatic cancer Mother    Lung cancer Father    Diabetes Maternal Grandmother     Social History   Socioeconomic History   Marital status: Single    Spouse name: Not on file   Number of children: Not on file   Years of education: Not on file   Highest education level: Not on file  Occupational History   Not on file  Tobacco Use   Smoking status: Never   Smokeless tobacco: Never  Vaping Use   Vaping Use: Never used  Substance and Sexual Activity   Alcohol use: Yes    Comment: socially   Drug use: No   Sexual activity: Not on file   Other Topics Concern   Not on file  Social History Narrative   Not on file   Social Determinants of Health   Financial Resource Strain: Not on file  Food Insecurity: Not on file  Transportation Needs: Not on file  Physical Activity: Not on file  Stress: Not on file  Social Connections: Not on file  Intimate Partner Violence: Not on file    Review of Systems:    Constitutional: No weight loss, fever or chills Skin: No rash  Cardiovascular: No chest pain Respiratory: No SOB  Gastrointestinal: See HPI and otherwise negative Genitourinary: No dysuria Neurological: No headache, dizziness or syncope Musculoskeletal: No new muscle or joint pain Hematologic: No bleeding Psychiatric: No history of depression or anxiety   Physical Exam:  Vital signs: BP 128/90   Pulse 82   Ht '5\' 6"'$  (1.676 m)   Wt 267 lb (121.1 kg)   LMP 11/18/2012   SpO2 98%   BMI 43.09 kg/m    Constitutional:   Pleasant obese AA female appears to be in NAD, Well developed, Well nourished, alert and cooperative Head:  Normocephalic and atraumatic. Eyes:   PEERL, EOMI. No icterus. Conjunctiva pink. Ears:  Normal auditory acuity. Neck:  Supple Throat: Oral cavity and pharynx without inflammation, swelling or lesion.  Respiratory: Respirations even and unlabored. Lungs clear to auscultation bilaterally.   No wheezes, crackles, or rhonchi.  Cardiovascular: Normal S1, S2. No MRG. Regular rate and rhythm. No peripheral edema, cyanosis or pallor.  Gastrointestinal:  Soft, nondistended, nontender. No rebound or guarding. Normal bowel sounds. No appreciable masses or hepatomegaly. Rectal:  Not performed.  Msk:  Symmetrical without gross deformities. Without edema, no deformity or joint abnormality.  Neurologic:  Alert and  oriented x4;  grossly normal neurologically.  Skin:   Dry and intact without significant lesions or rashes. Psychiatric: Demonstrates good judgement and reason without abnormal affect or  behaviors.  No recent labs or imaging.  Assessment: 1.  Screening for colorectal cancer: Patient is 16 never had screening for colorectal cancer 2.  Family history of pancreatic cancer in her mother: Passed away at the age of 46, PCP is already done genetic testing for which she is negative  Plan: 1.  Scheduled patient for a screening colonoscopy in the [place] with Dr. Silverio Decamp as she had specific scheduling stipulations given that she is a school bus driver for [employer] and wanted to get this done before she started back to work.  Did provide the patient a detailed list of risks for the procedure and she agrees to proceed.  She will continue to follow with Dr. Silverio Decamp after time of  procedure as her primary GI physician. 2.  Discussed mother's history of pancreatic cancer.  The patient's genetic testing was negative meaning that she is not a candidate for further screening with MRI/EUS. 3.  Patient to follow in clinic per recommendations from Dr. Silverio Decamp after time of procedure.   Sandra Newer, PA-C [facility] 12/05/2020, 11:24 AM  Cc: Kennyth Arnold, FNP

## 2020-12-05 NOTE — Telephone Encounter (Signed)
Received E-Script from pharmacy stating Suprep is not covered by insurance.  Attempted to reach patient by phone to make her aware, but her voicemail box was full and unable to leave a message.  GoLytely was sent to pharmacy as requested.  New instructions have been printed.  Will attempt to reach patient to make her aware.

## 2020-12-05 NOTE — Patient Instructions (Signed)
Stop Phentermine 10 day prior to procedure.   You have been scheduled for a colonoscopy. Please follow written instructions given to you at your visit today.  Please pick up your prep supplies at the pharmacy within the next 1-3 days. If you use inhalers (even only as needed), please bring them with you on the day of your procedure.

## 2020-12-06 NOTE — Telephone Encounter (Signed)
Left message on Racquel's voicemail to have Latrice to return call for change in colonoscopy instructions.  Will continue efforts.

## 2020-12-06 NOTE — Telephone Encounter (Signed)
Attempted to reach patient by phone to make her aware, but her voicemail box was full and unable to leave a message

## 2020-12-07 NOTE — Telephone Encounter (Signed)
Left message on patient's voicemail to return call to discuss change in bowel prep.  Advised patient to ask for Elmyra Ricks if she calls before 12-07-20 at 5, as I will be out of the office next. Instructed to ask to speak with Barbette Or, CMA if patient returns call next week.  Instructions have been typed and left at [facility] desk for patient to pick up.

## 2020-12-12 ENCOUNTER — Telehealth: Payer: Self-pay | Admitting: *Deleted

## 2020-12-12 NOTE — Telephone Encounter (Signed)
Left message for patient to call office.  

## 2020-12-12 NOTE — Telephone Encounter (Signed)
Error

## 2020-12-12 NOTE — Telephone Encounter (Signed)
Patient returned your call, please call patient one more time.   

## 2020-12-13 NOTE — Telephone Encounter (Signed)
Spoke with patient and informed that Suprep was not covered and we sent in a new prep. Patient informed new instructions will be placed up front for her to pick up. Patient voiced understanding.

## 2020-12-20 ENCOUNTER — Other Ambulatory Visit: Payer: Self-pay

## 2020-12-20 ENCOUNTER — Ambulatory Visit (AMBULATORY_SURGERY_CENTER): Payer: BC Managed Care – PPO | Admitting: Gastroenterology

## 2020-12-20 ENCOUNTER — Encounter: Payer: Self-pay | Admitting: Gastroenterology

## 2020-12-20 VITALS — BP 142/92 | HR 78 | Temp 97.7°F | Resp 17 | Ht 66.0 in | Wt 267.0 lb

## 2020-12-20 DIAGNOSIS — Z1211 Encounter for screening for malignant neoplasm of colon: Secondary | ICD-10-CM | POA: Diagnosis present

## 2020-12-20 DIAGNOSIS — K635 Polyp of colon: Secondary | ICD-10-CM | POA: Diagnosis not present

## 2020-12-20 DIAGNOSIS — D123 Benign neoplasm of transverse colon: Secondary | ICD-10-CM

## 2020-12-20 DIAGNOSIS — D128 Benign neoplasm of rectum: Secondary | ICD-10-CM | POA: Diagnosis not present

## 2020-12-20 DIAGNOSIS — K621 Rectal polyp: Secondary | ICD-10-CM

## 2020-12-20 MED ORDER — SODIUM CHLORIDE 0.9 % IV SOLN: 500.0000 mL | Freq: Once | INTRAVENOUS | Status: DC

## 2020-12-20 NOTE — Progress Notes (Signed)
Reviewed and agree with documentation and assessment and plan. K. Veena Shaelyn Decarli , MD   

## 2020-12-20 NOTE — Progress Notes (Signed)
Report to PACU, RN, vss, BBS= Clear.  

## 2020-12-20 NOTE — Op Note (Signed)
[facility] Patient Name: Sandra Shea Procedure Date: 12/20/2020 11:02 AM MRN: [id number] Endoscopist: Mauri Pole , MD Age: 55 Referring MD:  Date of Birth: December 20, 1965 Gender: Female Account #: [id number] Procedure:                Colonoscopy Indications:              Screening for colorectal malignant neoplasm Medicines:                Monitored Anesthesia Care Procedure:                Pre-Anesthesia Assessment:                           - Prior to the procedure, a History and Physical                            was performed, and patient medications and                            allergies were reviewed. The patient's tolerance of                            previous anesthesia was also reviewed. The risks                            and benefits of the procedure and the sedation                            options and risks were discussed with the patient.                            All questions were answered, and informed consent                            was obtained. Prior Anticoagulants: The patient has                            taken no previous anticoagulant or antiplatelet                            agents. ASA Grade Assessment: II - A patient with                            mild systemic disease. After reviewing the risks                            and benefits, the patient was deemed in                            satisfactory condition to undergo the procedure.                           After obtaining informed consent, the colonoscope  was passed under direct vision. Throughout the                            procedure, the patient's blood pressure, pulse, and                            oxygen saturations were monitored continuously. The                            [id number] PCF-H190TL Slim SB Colonoscope was introduced                            through the anus and advanced to the the cecum,                            identified by  appendiceal orifice and ileocecal                            valve. The colonoscopy was performed without                            difficulty. The patient tolerated the procedure                            well. The quality of the bowel preparation was                            excellent. The ileocecal valve, appendiceal                            orifice, and rectum were photographed. Scope In: 11:18:25 AM Scope Out: 11:36:55 AM Scope Withdrawal Time: 0 hours 12 minutes 19 seconds  Total Procedure Duration: 0 hours 18 minutes 30 seconds  Findings:                 The perianal and digital rectal examinations were                            normal.                           Two sessile polyps were found in the rectum and                            transverse colon. The polyps were 1 to 2 mm in                            size. These polyps were removed with a cold biopsy                            forceps. Resection and retrieval were complete.                           A few small-mouthed diverticula were found in the  sigmoid colon and ascending colon.                           Non-bleeding external and internal hemorrhoids were                            found during retroflexion. The hemorrhoids were                            medium-sized. Complications:            No immediate complications. Estimated Blood Loss:     Estimated blood loss was minimal. Impression:               - Two 1 to 2 mm polyps in the rectum and in the                            transverse colon, removed with a cold biopsy                            forceps. Resected and retrieved.                           - Diverticulosis in the sigmoid colon and in the                            ascending colon.                           - Non-bleeding external and internal hemorrhoids. Recommendation:           - Patient has a contact number available for                            emergencies.  The signs and symptoms of potential                            delayed complications were discussed with the                            patient. Return to normal activities tomorrow.                            Written discharge instructions were provided to the                            patient.                           - Resume previous diet.                           - Continue present medications.                           - Await pathology results.                           -  Repeat colonoscopy in 5-10 years for surveillance                            based on pathology results. Mauri Pole, MD 12/20/2020 11:40:21 AM This report has been signed electronically.

## 2020-12-20 NOTE — Progress Notes (Signed)
Called to room to assist during endoscopic procedure.  Patient ID and intended procedure confirmed with present staff. Received instructions for my participation in the procedure from the performing physician.  

## 2020-12-20 NOTE — Patient Instructions (Signed)
Information on polyps, hemorrhoids and diverticulosis given to you today.  Await pathology results.  Resume previous diet and medications.   YOU HAD AN ENDOSCOPIC PROCEDURE TODAY AT THE [facility]:   Refer to the procedure report that was given to you for any specific questions about what was found during the examination.  If the procedure report does not answer your questions, please call your gastroenterologist to clarify.  If you requested that your care partner not be given the details of your procedure findings, then the procedure report has been included in a sealed envelope for you to review at your convenience later.  YOU SHOULD EXPECT: Some feelings of bloating in the abdomen. Passage of more gas than usual.  Walking can help get rid of the air that was put into your GI tract during the procedure and reduce the bloating. If you had a lower endoscopy (such as a colonoscopy or flexible sigmoidoscopy) you may notice spotting of blood in your stool or on the toilet paper. If you underwent a bowel prep for your procedure, you may not have a normal bowel movement for a few days.  Please Note:  You might notice some irritation and congestion in your nose or some drainage.  This is from the oxygen used during your procedure.  There is no need for concern and it should clear up in a day or so.  SYMPTOMS TO REPORT IMMEDIATELY:  Following lower endoscopy (colonoscopy or flexible sigmoidoscopy):  Excessive amounts of blood in the stool  Significant tenderness or worsening of abdominal pains  Swelling of the abdomen that is new, acute  Fever of 100F or higher   For urgent or emergent issues, a gastroenterologist can be reached at any hour by calling [phone]. Do not use MyChart messaging for urgent concerns.    DIET:  We do recommend a small meal at first, but then you may proceed to your regular diet.  Drink plenty of fluids but you should avoid alcoholic beverages for 24  hours.  ACTIVITY:  You should plan to take it easy for the rest of today and you should NOT DRIVE or use heavy machinery until tomorrow (because of the sedation medicines used during the test).    FOLLOW UP: Our staff will call the number listed on your records 48-72 hours following your procedure to check on you and address any questions or concerns that you may have regarding the information given to you following your procedure. If we do not reach you, we will leave a message.  We will attempt to reach you two times.  During this call, we will ask if you have developed any symptoms of COVID 19. If you develop any symptoms (ie: fever, flu-like symptoms, shortness of breath, cough etc.) before then, please call [phone].  If you test positive for Covid 19 in the 2 weeks post procedure, please call and report this information to us.    If any biopsies were taken you will be contacted by phone or by letter within the next 1-3 weeks.  Please call us at [phone] if you have not heard about the biopsies in 3 weeks.    SIGNATURES/CONFIDENTIALITY: You and/or your care partner have signed paperwork which will be entered into your electronic medical record.  These signatures attest to the fact that that the information above on your After Visit Summary has been reviewed and is understood.  Full responsibility of the confidentiality of this discharge information lies with you   and/or your care-partner.  

## 2020-12-20 NOTE — Progress Notes (Signed)
VS- Nash Mantis  Cell phone off per pt  Last dose Phentermine 12-09-20

## 2020-12-24 ENCOUNTER — Telehealth: Payer: Self-pay | Admitting: *Deleted

## 2020-12-24 ENCOUNTER — Telehealth: Payer: Self-pay

## 2020-12-24 NOTE — Telephone Encounter (Signed)
First attempt follow up call to pt, lm for pt to call if having any problems or questions, otherwise we will call them back later this morning or early this afternoon.  °

## 2020-12-24 NOTE — Telephone Encounter (Signed)
  Follow up Call-  Call back number 12/20/2020  Post procedure Call Back phone  # [phone]  Permission to leave phone message Yes  Some recent data might be hidden     No answer at 2nd attempt follow up phone call.  Left message on voicemail.

## 2021-01-05 ENCOUNTER — Encounter: Payer: Self-pay | Admitting: Gastroenterology

## 2021-05-31 ENCOUNTER — Other Ambulatory Visit: Payer: Self-pay | Admitting: Family

## 2021-05-31 MED ORDER — METRONIDAZOLE 500 MG PO TABS: 500.0000 mg | ORAL_TABLET | Freq: Two times a day (BID) | ORAL | 0 refills | Status: AC

## 2023-01-05 ENCOUNTER — Other Ambulatory Visit: Payer: Self-pay | Admitting: Obstetrics and Gynecology

## 2023-01-05 DIAGNOSIS — N63 Unspecified lump in unspecified breast: Secondary | ICD-10-CM

## 2023-01-06 ENCOUNTER — Other Ambulatory Visit: Payer: Self-pay | Admitting: Obstetrics and Gynecology

## 2023-01-06 DIAGNOSIS — Z800 Family history of malignant neoplasm of digestive organs: Secondary | ICD-10-CM

## 2023-01-27 ENCOUNTER — Ambulatory Visit
Admission: RE | Admit: 2023-01-27 | Discharge: 2023-01-27 | Disposition: A | Discharge status: Home / Self Care | Payer: BC Managed Care – PPO | Source: Ambulatory Visit | Attending: Obstetrics and Gynecology | Admitting: Obstetrics and Gynecology

## 2023-01-27 DIAGNOSIS — Z800 Family history of malignant neoplasm of digestive organs: Secondary | ICD-10-CM

## 2023-01-27 MED ORDER — GADOPICLENOL 0.5 MMOL/ML IV SOLN
10.0000 mL | Freq: Once | INTRAVENOUS | Status: AC | PRN
  Administered 2023-01-27: 10 mL via INTRAVENOUS

## 2023-02-04 ENCOUNTER — Ambulatory Visit
Admission: RE | Admit: 2023-02-04 | Discharge: 2023-02-04 | Disposition: A | Discharge status: Home / Self Care | Payer: BC Managed Care – PPO | Source: Ambulatory Visit | Attending: Obstetrics and Gynecology | Admitting: Obstetrics and Gynecology

## 2023-02-04 DIAGNOSIS — N63 Unspecified lump in unspecified breast: Secondary | ICD-10-CM

## 2023-07-06 ENCOUNTER — Other Ambulatory Visit: Payer: Self-pay | Admitting: Family

## 2023-07-06 MED ORDER — CIPROFLOXACIN HCL 250 MG PO TABS: 250.0000 mg | ORAL_TABLET | Freq: Two times a day (BID) | ORAL | 0 refills | Status: AC

## 2023-11-25 ENCOUNTER — Encounter: Payer: Self-pay | Admitting: Orthopedic Surgery

## 2023-11-25 ENCOUNTER — Ambulatory Visit: Payer: Self-pay | Admitting: Orthopedic Surgery

## 2023-11-25 ENCOUNTER — Other Ambulatory Visit (INDEPENDENT_AMBULATORY_CARE_PROVIDER_SITE_OTHER): Payer: Self-pay

## 2023-11-25 VITALS — Ht 65.5 in | Wt 263.0 lb

## 2023-11-25 DIAGNOSIS — M79605 Pain in left leg: Secondary | ICD-10-CM | POA: Diagnosis not present

## 2023-11-25 DIAGNOSIS — M1712 Unilateral primary osteoarthritis, left knee: Secondary | ICD-10-CM | POA: Diagnosis not present

## 2023-11-25 MED ORDER — MELOXICAM 15 MG PO TABS: 15.0000 mg | ORAL_TABLET | Freq: Every day | ORAL | 0 refills | Status: AC

## 2023-11-25 NOTE — Progress Notes (Unsigned)
 Office Visit Note   Patient: Sandra Shea           Date of Birth: November 12, 1965           MRN: [id number] Visit Date: 11/25/2023 Requested by: Douglass Kenney NOVAK, FNP [address] [place],  [place] [address] PCP: Douglass Kenney NOVAK, FNP  Subjective: Chief Complaint  Patient presents with   Other    Patient reports left knee and back pain    HPI: Sandra Shea is a 58 y.o. female who presents to the office reporting left knee and low back pain.  Does describe some difficulty with ambulating.  Had right total knee replacement which is doing well years ago.  She does work 8 hours a day standing on the concrete floor.  Describes pretty constant pain with swelling and stiffness.  Takes ibuprofen and Aleve  without much relief.  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  Compression helps some..                ROS: All systems reviewed are negative as they relate to the chief complaint within the history of present illness.  Patient denies fevers or chills.  Assessment & Plan: Visit Diagnoses:  1. Pain in left leg     Plan: Impression is left knee arthritis.  Plan injection into the knee.  She also has some low back pain.  Physical therapy 1 time a week for 6 weeks.  We will likely order MRI scan at that time to evaluate arthritis for possible epidural steroid injections.  Mobic  and Ultram also prescribed.  She will follow-up with us  in 6 weeks so we can get her set up for the MRI scan.  Follow-Up Instructions: No follow-ups on file.   Orders:  Orders Placed This Encounter  Procedures   XR Knee 1-2 Views Left   XR Lumbar Spine 2-3 Views   MR Lumbar Spine w/o contrast   Ambulatory referral to Physical Therapy   Meds ordered this encounter  Medications   meloxicam  (MOBIC ) 15 MG tablet    Sig: Take 1 tablet (15 mg total) by mouth daily.    Dispense:  30 tablet    Refill:  0      Procedures: Large Joint Inj: L knee on 11/25/2023 5:51 PM Indications:  diagnostic evaluation, joint swelling and pain Details: 18 G 1.5 in needle, superolateral approach  Arthrogram: No  Medications: 5 mL lidocaine  1 %; 4 mL bupivacaine  0.25 %; 40 mg triamcinolone  acetonide 40 MG/ML Outcome: tolerated well, no immediate complications Procedure, treatment alternatives, risks and benefits explained, specific risks discussed. Consent was given by the patient. Immediately prior to procedure a time out was called to verify the correct patient, procedure, equipment, support staff and site/side marked as required. Patient was prepped and draped in the usual sterile fashion.       Clinical Data: No additional findings.  Objective: Vital Signs: Ht 5' 5.5 (1.664 m)   Wt 263 lb (119.3 kg)   LMP 11/18/2012   BMI 43.10 kg/m   Physical Exam:  Constitutional: Patient appears well-developed HEENT:  Head: Normocephalic Eyes:EOM are normal Neck: Normal range of motion Cardiovascular: Normal rate Pulmonary/chest: Effort normal Neurologic: Patient is alert Skin: Skin is warm Psychiatric: Patient has normal mood and affect  Ortho Exam: Ortho exam demonstrates good range of motion of the right knee past 90 degrees with no effusion.  Left knee lacks about 5 degrees of flexion  and then can bend just past 90.  Extensor mechanism intact.  Does have some back pain with forward and lateral bending but no trochanteric tenderness.  5 out of 5 ankle dorsiflexion plantarflexion quad hamstring strength with symmetric reflexes and palpable pedal pulses.  Specialty Comments:  No specialty comments available.  Imaging: No results found.   PMFS History: Patient Active Problem List   Diagnosis Date Noted   Arthritis of knee 11/11/2018   Osteoarthritis of left knee 10/18/2014   Essential hypertension 09/24/2012   Obesity, unspecified 09/24/2012   Past Medical History:  Diagnosis Date   Arthritis    Hypertension     Family History  Problem Relation Age of Onset    Pancreatic cancer Mother    Lung cancer Father    Diabetes Maternal Grandmother    Colon cancer Neg Hx    Esophageal cancer Neg Hx    Rectal cancer Neg Hx    Stomach cancer Neg Hx     Past Surgical History:  Procedure Laterality Date   BREAST EXCISIONAL BIOPSY Bilateral    KNEE SURGERY     TOTAL KNEE ARTHROPLASTY Right 11/11/2018   TOTAL KNEE ARTHROPLASTY Right 11/11/2018   Procedure: RIGHT TOTAL KNEE ARTHROPLASTY;  Surgeon: Addie Cordella Hamilton, MD;  Location: [facility] OR;  Service: Orthopedics;  Laterality: Right;   TUBAL LIGATION     Social History   Occupational History   Not on file  Tobacco Use   Smoking status: Never   Smokeless tobacco: Never  Vaping Use   Vaping status: Never Used  Substance and Sexual Activity   Alcohol use: Yes    Comment: socially   Drug use: No   Sexual activity: Not on file

## 2023-11-27 MED ORDER — BUPIVACAINE HCL 0.25 % IJ SOLN
4.0000 mL | INTRAMUSCULAR | Status: AC | PRN
Start: 2023-11-25 — End: 2023-11-25
  Administered 2023-11-25: 4 mL via INTRA_ARTICULAR

## 2023-11-27 MED ORDER — TRAMADOL HCL 50 MG PO TABS
50.0000 mg | ORAL_TABLET | Freq: Four times a day (QID) | ORAL | 0 refills | Status: AC | PRN
Start: 2023-11-27 — End: ?

## 2023-11-27 MED ORDER — TRIAMCINOLONE ACETONIDE 40 MG/ML IJ SUSP
40.0000 mg | INTRAMUSCULAR | Status: AC | PRN
Start: 2023-11-25 — End: 2023-11-25
  Administered 2023-11-25: 40 mg via INTRA_ARTICULAR

## 2023-11-27 MED ORDER — LIDOCAINE HCL 1 % IJ SOLN
5.0000 mL | INTRAMUSCULAR | Status: AC | PRN
Start: 2023-11-25 — End: 2023-11-25
  Administered 2023-11-25: 5 mL

## 2023-12-28 NOTE — Therapy (Incomplete)
 OUTPATIENT PHYSICAL THERAPY THORACOLUMBAR EVALUATION   Patient Name: Sandra Shea MRN: [id number] DOB:11-30-1965, 58 y.o., female Today's Date: 12/28/2023  END OF SESSION:   Past Medical History:  Diagnosis Date   Arthritis    Hypertension    Past Surgical History:  Procedure Laterality Date   BREAST EXCISIONAL BIOPSY Bilateral    KNEE SURGERY     TOTAL KNEE ARTHROPLASTY Right 11/11/2018   TOTAL KNEE ARTHROPLASTY Right 11/11/2018   Procedure: RIGHT TOTAL KNEE ARTHROPLASTY;  Surgeon: Addie Cordella Hamilton, MD;  Location: [facility] OR;  Service: Orthopedics;  Laterality: Right;   TUBAL LIGATION     Patient Active Problem List   Diagnosis Date Noted   Arthritis of knee 11/11/2018   Osteoarthritis of left knee 10/18/2014   Essential hypertension 09/24/2012   Obesity, unspecified 09/24/2012    PCP: Douglass Kenney NOVAK, FNP   REFERRING PROVIDER: Addie Cordella Hamilton, MD   REFERRING DIAG: [phone] (ICD-10-CM) - Pain in left leg  THERAPY DIAG:  No diagnosis found.  RATIONALE FOR EVALUATION AND TREATMENT: Rehabilitation  ONSET DATE: ***  NEXT MD VISIT: ***   SUBJECTIVE:                                                                                                                                                                                                         SUBJECTIVE STATEMENT: 58 y/o referred to PT from [facility] for L leg pain.  Ortho note indicates that she has severe L knee OA and received a cortisone injection on 11/25/23.   She also has LBP for which an MRI is to be done 12/30/23.  Results are pending at this time.     PAIN: Are you having pain? Yes: NPRS scale: *** Pain location: *** Pain description: *** Aggravating factors: *** Relieving factors: ***  PERTINENT HISTORY:  OA, HTN, obesity, R TKA  PRECAUTIONS: None  RED FLAGS: {PT Red Flags:29287}  WEIGHT BEARING RESTRICTIONS: No  FALLS:  Has patient fallen in last 6 months? {fallsyesno:27318}  LIVING  ENVIRONMENT: Lives with: {[facility] lives with:25569::lives with their family} Lives in: {Lives in:25570} Stairs: {opstairs:27293} Has following equipment at home: {Assistive devices:23999}  OCCUPATION: ***  PLOF: {PLOF:24004}  PATIENT GOALS: ***   OBJECTIVE: (objective measures completed at initial evaluation unless otherwise dated)  DIAGNOSTIC FINDINGS:    [facility] xrays done on 11/25/23:  AP lateral radiographs left knee reviewed.  Severe end-stage  tricompartmental arthritis is present worse in the medial side.  No acute  fracture.  AP lateral radiographs lumbar spine reviewed.  No spondylolisthesis or  compression fractures.  Moderate degenerative changes present between the  vertebral bodies and in the lower lumbar spine facet joints.  PATIENT SURVEYS:  {rehab surveys:24030}  SCREENING FOR RED FLAGS: Bowel or bladder incontinence: {Yes/No:304960894} Spinal tumors: {Yes/No:304960894} Cauda equina syndrome: {Yes/No:304960894} Compression fracture: {Yes/No:304960894} Abdominal aneurysm: {Yes/No:304960894}  COGNITION:  Overall cognitive status: {cognition:24006}    SENSATION: {sensation:27233}  POSTURE:  {posture:25561}  PALPATION: ***  LUMBAR ROM:   Active  Eval  Flexion   Extension   Right lateral flexion   Left lateral flexion   Right rotation   Left rotation   (Blank rows = not tested)  MUSCLE LENGTH: Hamstrings: Right *** deg; Left *** deg Thomas test: Right *** deg; Left *** deg Hamstrings: *** ITB: *** Piriformis: *** Hip flexors: *** Quads: *** Heelcord: ***  LOWER EXTREMITY ROM:     Active  Right eval Left eval  Hip flexion    Hip extension    Hip abduction    Hip adduction    Hip internal rotation    Hip external rotation    Knee flexion    Knee extension    Ankle dorsiflexion    Ankle plantarflexion    Ankle inversion    Ankle eversion    (Blank rows = not tested)  LOWER EXTREMITY MMT:    MMT Right eval Left eval  Hip  flexion    Hip extension    Hip abduction    Hip adduction    Hip internal rotation    Hip external rotation    Knee flexion    Knee extension    Ankle dorsiflexion    Ankle plantarflexion    Ankle inversion    Ankle eversion     (Blank rows = not tested)  LUMBAR SPECIAL TESTS:  {lumbar special test:25242}  FUNCTIONAL TESTS:  {Functional tests:24029}  GAIT: Distance walked: *** Assistive device utilized: {Assistive devices:23999} Level of assistance: {Levels of assistance:24026} Gait pattern: {gait characteristics:25376} Comments: ***   TODAY'S TREATMENT:   ***   PATIENT EDUCATION:  Education details: PT eval findings, anticipated POC, and initial HEP  Person educated: Patient Education method: Programmer, multimedia, Demonstration, Verbal cues, Tactile cues, and Handouts Education comprehension: verbalized understanding, returned demonstration, verbal cues required, and needs further education  HOME EXERCISE PROGRAM: ***   ASSESSMENT:  CLINICAL IMPRESSION: [name] is a 58 y.o. female who was referred to physical therapy for evaluation and treatment for LLE pain.   She has severe L knee OA and chronic LBP as well for which she just had an MRI.   Patient reports onset of *** pain beginning ***. Pain is worse with ***.  Patient has deficits in *** ROM, *** LE flexibility, *** strength, abnormal posture, and TTP with abnormal muscle tension *** which are interfering with ADLs and are impacting quality of life.  On Modified Oswestry patient scored ***/50 demonstrating ***% or *** disability.  Denisse will benefit from skilled PT to address above deficits to improve mobility and activity tolerance with decreased pain interference.   ***  OBJECTIVE IMPAIRMENTS: {opptimpairments:25111}.   ACTIVITY LIMITATIONS: {activitylimitations:27494}  PARTICIPATION LIMITATIONS: {participationrestrictions:25113}  PERSONAL FACTORS: Age, Fitness, Time since onset of injury/illness/exacerbation,  and 1-2 comorbidities:  OA, HTN, obesity, R TKA   are also affecting patient's functional outcome.   REHAB POTENTIAL: Good  CLINICAL DECISION MAKING: Evolving/moderate complexity  EVALUATION COMPLEXITY: Moderate   GOALS: Goals reviewed with patient? Yes  SHORT TERM GOALS: Target date: ***  Patient will be independent with initial HEP  to improve outcomes and carryover.  Baseline: 100% PT assist required for correct completion Goal status: INITIAL  2.  Patient will report 25% improvement in low back pain to improve QOL. Baseline: *** Goal status: INITIAL  3.  *** Baseline:  Goal status: {GOALSTATUS:25110}   LONG TERM GOALS: Target date: ***  Patient will be independent with ongoing/advanced HEP for self-management at home.  Baseline: no advanced HEP yet Goal status: INITIAL  2.  Patient will report 50-75% improvement in low back pain to improve QOL.  Baseline: *** Goal status: INITIAL  3.  Patient to demonstrate ability to achieve and maintain good spinal alignment/posturing and body mechanics needed for daily activities. Baseline: *** Goal status: INITIAL  4.  Patient will demonstrate full pain free lumbar ROM to perform ADLs.   Baseline: Refer to above lumbar ROM table Goal status: INITIAL  5.  Patient will demonstrate improved *** strength to >/= ***/5 for improved stability and ease of mobility. Baseline: Refer to above LE MMT table Goal status: INITIAL  6. Patient will report </= ***% on Modified Oswestry (MCID = 12%) to demonstrate improved functional ability with decreased pain interference. Baseline: *** Goal status: INITIAL  7.  Patient will tolerate *** min of (standing/sitting/walking) w/o increased pain to allow for *** improved mobility and activity tolerance. Baseline: *** Goal status: {GOALSTATUS:25110}  8.  Patient will report centralization of radicular symptoms.  Baseline: *** Goal status: {GOALSTATUS:25110}  9.  Patient will ***.   Baseline: *** Goal status: {GOALSTATUS:25110}   10.  *** Baseline: *** Goal status: {GOALSTATUS:25110}    PLAN:  PT FREQUENCY: 1-2x/week  PT DURATION: 6 weeks  PLANNED INTERVENTIONS: 97110-Therapeutic exercises, 97530- Therapeutic activity, 97112- Neuromuscular re-education, 97535- Self Care, 02859- Manual therapy, [phone]- Aquatic Therapy, [phone]- Electrical stimulation (unattended), 97016- Vasopneumatic device, [id number]- Ultrasound, [id number]- Traction (mechanical), [id number]- Ionotophoresis 4mg /ml Dexamethasone , 79439 (1-2 muscles), 20561 (3+ muscles)- Dry Needling, Patient/Family education, Balance training, Stair training, Taping, Joint mobilization, Spinal mobilization, Moist heat, and Biofeedback  PLAN FOR NEXT SESSION: PIERRETTE RED SENIOR, PT 12/28/2023, 2:47 PM

## 2023-12-30 ENCOUNTER — Ambulatory Visit: Admitting: Rehabilitation

## 2024-01-05 ENCOUNTER — Emergency Department (HOSPITAL_COMMUNITY)

## 2024-01-05 ENCOUNTER — Emergency Department (HOSPITAL_COMMUNITY)
Admission: EM | Admit: 2024-01-05 | Discharge: 2024-01-05 | Disposition: A | Discharge status: Home / Self Care | Attending: Emergency Medicine | Admitting: Emergency Medicine

## 2024-01-05 ENCOUNTER — Other Ambulatory Visit: Payer: Self-pay

## 2024-01-05 ENCOUNTER — Encounter (HOSPITAL_COMMUNITY): Payer: Self-pay

## 2024-01-05 DIAGNOSIS — Y92410 Unspecified street and highway as the place of occurrence of the external cause: Secondary | ICD-10-CM | POA: Insufficient documentation

## 2024-01-05 DIAGNOSIS — M25512 Pain in left shoulder: Secondary | ICD-10-CM | POA: Insufficient documentation

## 2024-01-05 DIAGNOSIS — Z96651 Presence of right artificial knee joint: Secondary | ICD-10-CM | POA: Insufficient documentation

## 2024-01-05 DIAGNOSIS — Z7982 Long term (current) use of aspirin: Secondary | ICD-10-CM | POA: Insufficient documentation

## 2024-01-05 DIAGNOSIS — M25511 Pain in right shoulder: Secondary | ICD-10-CM | POA: Diagnosis not present

## 2024-01-05 DIAGNOSIS — I10 Essential (primary) hypertension: Secondary | ICD-10-CM | POA: Insufficient documentation

## 2024-01-05 DIAGNOSIS — M25561 Pain in right knee: Secondary | ICD-10-CM | POA: Insufficient documentation

## 2024-01-05 DIAGNOSIS — M25562 Pain in left knee: Secondary | ICD-10-CM | POA: Insufficient documentation

## 2024-01-05 DIAGNOSIS — Z79899 Other long term (current) drug therapy: Secondary | ICD-10-CM | POA: Insufficient documentation

## 2024-01-05 MED ORDER — HYDROCODONE-ACETAMINOPHEN 5-325 MG PO TABS
1.0000 | ORAL_TABLET | Freq: Once | ORAL | Status: AC
  Administered 2024-01-05: 1 via ORAL
  Filled 2024-01-05: qty 1
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
## 2024-01-05 NOTE — Progress Notes (Signed)
 Orthopedic Tech Progress Note Patient Details:  Sandra Shea January 16, 1966 [id number] Applied knee immobilizer and gave pt instructions on how to use crutches per order.  Ortho Devices Type of Ortho Device: Crutches, Knee Immobilizer Ortho Device/Splint Location: RLE Ortho Device/Splint Interventions: Ordered, Application, Adjustment   Post Interventions Patient Tolerated: Well Instructions Provided: Adjustment of device, Care of device, Poper ambulation with device  Morna Pink 01/05/2024, 11:24 PM

## 2024-01-05 NOTE — Discharge Instructions (Signed)
 Your x-rays this evening were reassuring.  Due to your difficulty bearing weight on your right knee I have ordered a knee immobilizer and crutches.  The crutches should be used to reduce weightbearing on the right leg.  The knee immobilizer should be used when walking.  Please follow-up with your orthopedic surgeon for further evaluation and management.  You may use your previously prescribed meloxicam  as an anti-inflammatory.  Do not take other anti-inflammatories such as ibuprofen while taking the meloxicam .  You may continue to take Tylenol  in addition to the meloxicam .  Return to the emergency department if you develop any life-threatening symptoms.
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
## 2024-01-18 ENCOUNTER — Ambulatory Visit: Admitting: Rehabilitation

## 2024-01-22 ENCOUNTER — Ambulatory Visit: Admitting: Orthopedic Surgery

## 2024-01-22 DIAGNOSIS — M1712 Unilateral primary osteoarthritis, left knee: Secondary | ICD-10-CM

## 2024-01-23 ENCOUNTER — Encounter: Payer: Self-pay | Admitting: Orthopedic Surgery
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
## 2024-02-05 ENCOUNTER — Ambulatory Visit (HOSPITAL_BASED_OUTPATIENT_CLINIC_OR_DEPARTMENT_OTHER)
Admission: RE | Admit: 2024-02-05 | Discharge: 2024-02-05 | Disposition: A | Discharge status: Home / Self Care | Source: Ambulatory Visit | Attending: Orthopedic Surgery | Admitting: Orthopedic Surgery

## 2024-02-05 DIAGNOSIS — M79605 Pain in left leg: Secondary | ICD-10-CM | POA: Diagnosis present

## 2024-02-10 ENCOUNTER — Encounter: Payer: Self-pay | Admitting: Orthopedic Surgery

## 2024-02-10 ENCOUNTER — Ambulatory Visit: Payer: Self-pay | Admitting: Orthopedic Surgery

## 2024-02-10 NOTE — Progress Notes (Signed)
 Hi Sandra Shea I sent her a MyChart message and if she wants to get set up with epidural steroid injection with Dr. Eldonna we can do that.  Thanks

## 2024-02-29 ENCOUNTER — Encounter: Payer: Self-pay | Admitting: Radiology
